# Patient Record
Sex: Female | Born: 1965 | Race: Black or African American | Hispanic: No | Marital: Single | Smoking: Never smoker
Health system: Southern US, Community
[De-identification: ages and names within clinical notes are randomized; demographics above are authoritative.]

## PROBLEM LIST (undated history)

## (undated) DIAGNOSIS — D573 Sickle-cell trait: Secondary | ICD-10-CM

## (undated) DIAGNOSIS — Z5189 Encounter for other specified aftercare: Secondary | ICD-10-CM

## (undated) DIAGNOSIS — D219 Benign neoplasm of connective and other soft tissue, unspecified: Secondary | ICD-10-CM

## (undated) DIAGNOSIS — N946 Dysmenorrhea, unspecified: Secondary | ICD-10-CM

## (undated) DIAGNOSIS — B373 Candidiasis of vulva and vagina: Secondary | ICD-10-CM

## (undated) DIAGNOSIS — Z9889 Other specified postprocedural states: Secondary | ICD-10-CM

## (undated) DIAGNOSIS — B3731 Acute candidiasis of vulva and vagina: Secondary | ICD-10-CM

## (undated) DIAGNOSIS — K4090 Unilateral inguinal hernia, without obstruction or gangrene, not specified as recurrent: Secondary | ICD-10-CM

## (undated) DIAGNOSIS — N920 Excessive and frequent menstruation with regular cycle: Secondary | ICD-10-CM

## (undated) DIAGNOSIS — R102 Pelvic and perineal pain: Secondary | ICD-10-CM

## (undated) DIAGNOSIS — D649 Anemia, unspecified: Secondary | ICD-10-CM

## (undated) DIAGNOSIS — I10 Essential (primary) hypertension: Secondary | ICD-10-CM

## (undated) HISTORY — DX: Sickle-cell trait: D57.3

## (undated) HISTORY — DX: Essential (primary) hypertension: I10

## (undated) HISTORY — DX: Pelvic and perineal pain: R10.2

## (undated) HISTORY — PX: HYSTEROSCOPY WITH D & C: SHX1775

## (undated) HISTORY — PX: INDUCED ABORTION: SHX677

## (undated) HISTORY — DX: Unilateral inguinal hernia, without obstruction or gangrene, not specified as recurrent: K40.90

## (undated) HISTORY — DX: Candidiasis of vulva and vagina: B37.3

## (undated) HISTORY — DX: Acute candidiasis of vulva and vagina: B37.31

## (undated) HISTORY — DX: Benign neoplasm of connective and other soft tissue, unspecified: D21.9

## (undated) HISTORY — DX: Dysmenorrhea, unspecified: N94.6

## (undated) HISTORY — DX: Encounter for other specified aftercare: Z51.89

## (undated) HISTORY — DX: Excessive and frequent menstruation with regular cycle: N92.0

---

## 1998-01-07 ENCOUNTER — Ambulatory Visit (HOSPITAL_COMMUNITY): Admission: RE | Admit: 1998-01-07 | Discharge: 1998-01-07 | Payer: Self-pay | Admitting: Obstetrics & Gynecology

## 1998-02-28 ENCOUNTER — Encounter: Admission: RE | Admit: 1998-02-28 | Discharge: 1998-05-29 | Payer: Self-pay | Admitting: *Deleted

## 1998-03-19 ENCOUNTER — Encounter: Admission: RE | Admit: 1998-03-19 | Discharge: 1998-03-19 | Payer: Self-pay | Admitting: Internal Medicine

## 1998-03-25 ENCOUNTER — Encounter: Admission: RE | Admit: 1998-03-25 | Discharge: 1998-03-25 | Payer: Self-pay | Admitting: Internal Medicine

## 1998-04-01 ENCOUNTER — Encounter: Admission: RE | Admit: 1998-04-01 | Discharge: 1998-04-01 | Payer: Self-pay | Admitting: Internal Medicine

## 1998-04-16 ENCOUNTER — Encounter: Admission: RE | Admit: 1998-04-16 | Discharge: 1998-04-16 | Payer: Self-pay | Admitting: Obstetrics & Gynecology

## 1998-04-30 ENCOUNTER — Encounter: Admission: RE | Admit: 1998-04-30 | Discharge: 1998-04-30 | Payer: Self-pay | Admitting: Obstetrics & Gynecology

## 1998-12-02 ENCOUNTER — Encounter: Admission: RE | Admit: 1998-12-02 | Discharge: 1998-12-02 | Payer: Self-pay | Admitting: Internal Medicine

## 1999-03-18 ENCOUNTER — Encounter: Admission: RE | Admit: 1999-03-18 | Discharge: 1999-03-18 | Payer: Self-pay | Admitting: Obstetrics & Gynecology

## 1999-04-09 ENCOUNTER — Encounter: Admission: RE | Admit: 1999-04-09 | Discharge: 1999-04-09 | Payer: Self-pay | Admitting: Internal Medicine

## 1999-07-01 ENCOUNTER — Encounter: Admission: RE | Admit: 1999-07-01 | Discharge: 1999-07-01 | Payer: Self-pay | Admitting: Internal Medicine

## 1999-09-25 ENCOUNTER — Other Ambulatory Visit: Admission: RE | Admit: 1999-09-25 | Discharge: 1999-09-25 | Payer: Self-pay | Admitting: Obstetrics

## 1999-09-25 ENCOUNTER — Encounter: Admission: RE | Admit: 1999-09-25 | Discharge: 1999-09-25 | Payer: Self-pay | Admitting: Internal Medicine

## 2000-01-02 ENCOUNTER — Encounter: Admission: RE | Admit: 2000-01-02 | Discharge: 2000-01-02 | Payer: Self-pay | Admitting: Internal Medicine

## 2000-01-07 ENCOUNTER — Encounter: Admission: RE | Admit: 2000-01-07 | Discharge: 2000-01-07 | Payer: Self-pay | Admitting: Internal Medicine

## 2000-02-11 ENCOUNTER — Encounter: Admission: RE | Admit: 2000-02-11 | Discharge: 2000-02-11 | Payer: Self-pay | Admitting: Internal Medicine

## 2000-04-02 ENCOUNTER — Encounter: Admission: RE | Admit: 2000-04-02 | Discharge: 2000-04-02 | Payer: Self-pay | Admitting: Hematology and Oncology

## 2000-08-06 ENCOUNTER — Encounter: Admission: RE | Admit: 2000-08-06 | Discharge: 2000-08-06 | Payer: Self-pay | Admitting: Internal Medicine

## 2000-09-08 ENCOUNTER — Encounter: Admission: RE | Admit: 2000-09-08 | Discharge: 2000-09-08 | Payer: Self-pay | Admitting: Internal Medicine

## 2000-09-27 ENCOUNTER — Encounter: Admission: RE | Admit: 2000-09-27 | Discharge: 2000-09-27 | Payer: Self-pay | Admitting: Internal Medicine

## 2000-09-27 ENCOUNTER — Ambulatory Visit (HOSPITAL_COMMUNITY): Admission: RE | Admit: 2000-09-27 | Discharge: 2000-09-27 | Payer: Self-pay | Admitting: Infectious Diseases

## 2000-10-05 ENCOUNTER — Encounter: Admission: RE | Admit: 2000-10-05 | Discharge: 2000-10-05 | Payer: Self-pay | Admitting: Internal Medicine

## 2000-12-16 ENCOUNTER — Encounter: Admission: RE | Admit: 2000-12-16 | Discharge: 2000-12-16 | Payer: Self-pay | Admitting: Obstetrics

## 2001-06-29 ENCOUNTER — Encounter: Admission: RE | Admit: 2001-06-29 | Discharge: 2001-06-29 | Payer: Self-pay | Admitting: Internal Medicine

## 2001-07-08 ENCOUNTER — Encounter: Admission: RE | Admit: 2001-07-08 | Discharge: 2001-07-08 | Payer: Self-pay | Admitting: Obstetrics & Gynecology

## 2001-07-26 ENCOUNTER — Encounter: Admission: RE | Admit: 2001-07-26 | Discharge: 2001-10-24 | Payer: Self-pay | Admitting: Internal Medicine

## 2001-08-17 ENCOUNTER — Encounter: Admission: RE | Admit: 2001-08-17 | Discharge: 2001-08-17 | Payer: Self-pay | Admitting: Internal Medicine

## 2002-05-15 ENCOUNTER — Encounter: Payer: Self-pay | Admitting: Internal Medicine

## 2002-05-15 ENCOUNTER — Encounter: Admission: RE | Admit: 2002-05-15 | Discharge: 2002-05-15 | Payer: Self-pay | Admitting: Internal Medicine

## 2002-06-15 ENCOUNTER — Ambulatory Visit (HOSPITAL_COMMUNITY): Admission: RE | Admit: 2002-06-15 | Discharge: 2002-06-15 | Payer: Self-pay | Admitting: Obstetrics

## 2002-06-15 ENCOUNTER — Encounter: Payer: Self-pay | Admitting: Obstetrics

## 2003-07-10 ENCOUNTER — Other Ambulatory Visit: Admission: RE | Admit: 2003-07-10 | Discharge: 2003-07-10 | Payer: Self-pay | Admitting: Obstetrics and Gynecology

## 2005-05-05 ENCOUNTER — Other Ambulatory Visit: Admission: RE | Admit: 2005-05-05 | Discharge: 2005-05-05 | Payer: Self-pay | Admitting: Obstetrics and Gynecology

## 2005-05-25 ENCOUNTER — Ambulatory Visit (HOSPITAL_COMMUNITY): Admission: RE | Admit: 2005-05-25 | Discharge: 2005-05-25 | Payer: Self-pay | Admitting: Obstetrics and Gynecology

## 2006-03-09 ENCOUNTER — Encounter: Admission: RE | Admit: 2006-03-09 | Discharge: 2006-03-09 | Payer: Self-pay | Admitting: Internal Medicine

## 2006-05-07 ENCOUNTER — Other Ambulatory Visit: Admission: RE | Admit: 2006-05-07 | Discharge: 2006-05-07 | Payer: Self-pay | Admitting: Obstetrics and Gynecology

## 2006-08-04 ENCOUNTER — Emergency Department (HOSPITAL_COMMUNITY): Admission: EM | Admit: 2006-08-04 | Discharge: 2006-08-04 | Payer: Self-pay | Admitting: Emergency Medicine

## 2006-08-17 ENCOUNTER — Encounter: Admission: RE | Admit: 2006-08-17 | Discharge: 2006-09-03 | Payer: Self-pay | Admitting: General Practice

## 2006-09-10 ENCOUNTER — Ambulatory Visit (HOSPITAL_COMMUNITY): Admission: RE | Admit: 2006-09-10 | Discharge: 2006-09-10 | Payer: Self-pay | Admitting: Obstetrics and Gynecology

## 2006-09-10 ENCOUNTER — Encounter (INDEPENDENT_AMBULATORY_CARE_PROVIDER_SITE_OTHER): Payer: Self-pay | Admitting: *Deleted

## 2007-02-01 ENCOUNTER — Encounter: Admission: RE | Admit: 2007-02-01 | Discharge: 2007-02-01 | Payer: Self-pay | Admitting: Internal Medicine

## 2007-03-11 ENCOUNTER — Encounter: Admission: RE | Admit: 2007-03-11 | Discharge: 2007-03-11 | Payer: Self-pay | Admitting: Internal Medicine

## 2007-03-15 ENCOUNTER — Encounter: Admission: RE | Admit: 2007-03-15 | Discharge: 2007-03-15 | Payer: Self-pay | Admitting: Gastroenterology

## 2007-03-16 ENCOUNTER — Encounter: Admission: RE | Admit: 2007-03-16 | Discharge: 2007-03-16 | Payer: Self-pay | Admitting: Internal Medicine

## 2008-09-27 ENCOUNTER — Encounter: Admission: RE | Admit: 2008-09-27 | Discharge: 2008-09-27 | Payer: Self-pay | Admitting: Internal Medicine

## 2009-11-01 ENCOUNTER — Encounter: Admission: RE | Admit: 2009-11-01 | Discharge: 2009-11-01 | Payer: Self-pay | Admitting: Internal Medicine

## 2011-04-03 NOTE — Op Note (Signed)
NAME:  Dana Fowler, Dana Fowler                ACCOUNT NO.:  [id number]   MEDICAL RECORD NO.:  [id number]          PATIENT TYPE:  AMB   LOCATION:  SDC                           FACILITY:  [facility]   PHYSICIAN:  Naima A. Dillard, M.D. DATE OF BIRTH:  1966/08/21   DATE OF PROCEDURE:  09/10/2006  DATE OF DISCHARGE:                                 OPERATIVE REPORT   PREOPERATIVE DIAGNOSIS:  Menorrhagia with endometrial polyps and fibroids.   POSTOPERATIVE DIAGNOSIS:  Menorrhagia with endometrial polyps and fibroids.   PROCEDURE:  Dilation and evacuation, hysteroscopy, polypectomy.   SURGEON:  Naima A. Dillard, M.D.   ASSISTANT:  None.   ANESTHESIA:  General laryngeal mask airway and 10 mL 1% lidocaine for  cervical block.   SPECIMENS:  Endometrial curettings and endometrial polyps.   ESTIMATED BLOOD LOSS:  Minimal.   COMPLICATIONS:  None.   DISPOSITION:  Patient to PACU in stable condition.   PROCEDURE IN DETAIL:  The patient was taken to the operating room where she  was placed in the dorsal lithotomy position  and given general anesthesia,  prepped and draped in a normal sterile fashion.  The bladder was drained  with a straight catheter.  The patient was examined and noted  to have an  anteverted uterus with no adnexal masses.  A bivalve speculum was placed  into the vagina.  The anterior lip of the cervix was grasped with a single  tooth tenaculum.  The cervix was dilated with Pratt dilators up to 21.  The  hysteroscope was placed into the uterine cavity all the way to the fundus.  Both ostia were seen.  The patient had two polyps near the fundus, no  submucosal fibroids.  Polyp forceps were placed into the uterus and some of  the polyp was removed.  I looked again and some of the polyp still remained.  The resectoscope was used to remove most of the remaining polyp.  Then, a  D&C was done and I also used polyp forceps through the scope to remove the  polyp.  Again, no submucosal fibroids  were seen.  Two polyps were removed  and endometrial curettings were sent to pathology.  All instruments were  then removed from the vagina and cervix.  Sponge, lap, and needle counts  were correct.  The patient had a 200 mL deficit of 3% Sorbitol.     Naima A. Normand Sloop, M.D.  Electronically Signed    NAD/MEDQ  D:  09/10/2006  T:  09/11/2006  Job:  [id number]

## 2011-04-03 NOTE — H&P (Signed)
NAME:  Dana Fowler, Dana Fowler                ACCOUNT NO.:  [id number]   MEDICAL RECORD NO.:  [id number]          PATIENT TYPE:  AMB   LOCATION:                                FACILITY:  [facility]   PHYSICIAN:  Naima A. Dillard, M.D. DATE OF BIRTH:  1966/06/30   DATE OF ADMISSION:  08/17/2006  DATE OF DISCHARGE:  09/03/2006                                HISTORY & PHYSICAL   HISTORY OF PRESENT ILLNESS:  The patient is a 45 year old female gravida 1,  para 0 who had an HSG back in 2006 which showed a questionable submucosal  fibroid. The patient has had a sonohysterogram recently on July 27, 2006 which was significant for three hyperechoic masses, the first one  measuring 1.84 x 1.49, the second one measuring 1.0 x 0.92 and the third one  measuring 1.02 x 0.77 cm. Ultrasound was also significant for measuring 8.43  x 6.07 x 4.98. Both ovaries were normal. The patient has two fibroids, one  is pedunculated measuring 6 x 5 x 5.89 and one is posterior and measuring 2  x 1 x 2.23 cm.  The patient says her periods last from 7-9 days and she has pain with her  periods. She also has a long-standing history of infertility.   PAST MEDICAL HISTORY:  Significant for non-insulin-dependent diabetes and  hypertension.   ALLERGIES:  PENICILLIN.   MEDICATIONS:  Include Metformin, glyburide and Benicar.   SOCIAL HISTORY:  The patient denies any tobacco, alcohol or illicit drug  use.   FAMILY HISTORY:  Significant in diabetes in her father, mother, and  hypertension. She denies any GYN cancer.   PHYSICAL EXAMINATION:  VITAL SIGNS: Blood pressure was 120/90. Her weight is  152 pounds.  HEENT:  Pupils are equal. Throat is clear.  NECK: Thyroid is not enlarged.  HEART: Regular rate and rhythm.  LUNGS: Clear to auscultation bilaterally.  BREASTS:  Have no masses, discharge, skin changes or nipple retraction  bilaterally.  BACK: Has no CVA tenderness bilaterally.  ABDOMEN: Nontender without any mass or  organomegaly.  EXTREMITIES: No clubbing, cyanosis or edema.  PELVIC EXAM:  Within normal limits. Vulva within normal limits. Cervix was  nontender without any lesions. Uterus is of normal shape and size, nothing  significant on exam. Adnexa nontender without masses.   ASSESSMENT:  1. Menorrhagia.  2. Pelvic pain.  3. Three endometrial polyps.  4. Fibroids.  5. Infertility.   The patient was offered D&C, hysteroscopy, polypectomy, possible myomectomy  and laparoscopy. The patient declined laparoscopy. She wishes to go with  just a D&C, hysteroscopy and polypectomy. She does not any further  work up with her infertility at this time. She does not have a partner and  is not interested in donor insemination. The patient understands the risks  are but not limited to bleeding, infection, damage to internal organs such  as bowel, bladder, major blood vessels.      Naima A. Normand Sloop, M.D.  Electronically Signed     NAD/MEDQ  D:  09/09/2006  T:  09/10/2006  Job:  [id number]

## 2012-01-15 ENCOUNTER — Other Ambulatory Visit: Payer: Self-pay | Admitting: Internal Medicine

## 2012-01-15 DIAGNOSIS — Z1231 Encounter for screening mammogram for malignant neoplasm of breast: Secondary | ICD-10-CM

## 2012-02-12 ENCOUNTER — Ambulatory Visit
Admission: RE | Admit: 2012-02-12 | Discharge: 2012-02-12 | Disposition: A | Discharge status: Home / Self Care | Payer: 59 | Source: Ambulatory Visit | Attending: Internal Medicine | Admitting: Internal Medicine

## 2012-02-12 DIAGNOSIS — Z1231 Encounter for screening mammogram for malignant neoplasm of breast: Secondary | ICD-10-CM

## 2012-07-29 ENCOUNTER — Telehealth: Payer: Self-pay | Admitting: Obstetrics and Gynecology

## 2012-08-22 ENCOUNTER — Ambulatory Visit (INDEPENDENT_AMBULATORY_CARE_PROVIDER_SITE_OTHER): Payer: 59 | Admitting: Obstetrics and Gynecology

## 2012-08-22 ENCOUNTER — Encounter: Payer: Self-pay | Admitting: Obstetrics and Gynecology

## 2012-08-22 VITALS — BP 140/88 | Temp 98.5°F | Ht 64.0 in | Wt 163.0 lb

## 2012-08-22 DIAGNOSIS — Z2089 Contact with and (suspected) exposure to other communicable diseases: Secondary | ICD-10-CM

## 2012-08-22 DIAGNOSIS — Z20818 Contact with and (suspected) exposure to other bacterial communicable diseases: Secondary | ICD-10-CM

## 2012-08-22 DIAGNOSIS — L0291 Cutaneous abscess, unspecified: Secondary | ICD-10-CM

## 2012-08-22 DIAGNOSIS — L0390 Cellulitis, unspecified: Secondary | ICD-10-CM

## 2012-08-22 MED ORDER — CIPROFLOXACIN HCL 500 MG PO TABS: 500.0000 mg | ORAL_TABLET | Freq: Two times a day (BID) | ORAL | Status: AC

## 2012-08-22 MED ORDER — METRONIDAZOLE 500 MG PO TABS: 500.0000 mg | ORAL_TABLET | Freq: Two times a day (BID) | ORAL | Status: DC

## 2012-08-22 NOTE — Progress Notes (Signed)
Vag. Discharge:no Odor:no Fever:no Irreg.Periods:yes Dyspareunia:yes Dysuria:no Frequency:yes Urgency:no Hematuria:no Kidney stones:no Constipation:no Diarrhea:no Rectal Bleeding: no Vomiting:no Nausea:no Pregnant:no Fibroids:yes Endometriosis:no BP 140/88  Temp 98.5 F (36.9 C) (Oral)  Ht 5\' 4"  (1.626 m)  Wt 163 lb (73.936 kg)  BMI 27.98 kg/m2  LMP 08/11/2012 Pt c/o painful and ire Hx of Ovarian Cyst: yes Hx IUD:no Hx STD-PID:no Appendectomy:no Gall Bladder Dz:no Pt with several complaints.  She states she has had abdominal pain for over 6 months.  She has fibroids and states her periods are painful.  Nothing makes it better or worse.  Her periods are also very heavy.  She also has an abscess on her right thigh.  It is painful and has been present for three days.  She is diabetic and states her fasting blood sugars have been elevated Physical Examination: General appearance - alert, well appearing, and in no distress Heart - normal rate and regular rhythm Abdomen - soft, nontender, nondistended, no masses or organomegaly Uterus palpated and tender about 14-16 week size Pelvic - normal external genitalia, vulva, vagina, cervix, uterus and adnexa, UTERUS: enlarged to 14-16 week's size, irregular, mobile Extremities - peripheral pulses normal, no pedal edema, no clubbing or cyanosis Skin - on the pts right thigh just below her groin is about a three cm abscess with red erythema extending down to the mid thigh.  It is tender and no drainage is present.  Symptomatic fibroids Thigh abscess [place] /EMBX@NV  Consent obtained her thifh was prepped with betadine.  Three cc 1% lidocaine used for anesthesia.  A small incision was made with the knife and the abscess was drained.  Blood and purulent material was drained from the abscess.  The abscess was packed and covered.  A culture was obtained.  Pt will take cipro and flagyl.

## 2012-08-22 NOTE — Patient Instructions (Signed)
Abscess An abscess is an infected area that contains a collection of pus and debris. It can occur in almost any part of the body. An abscess is also known as a furuncle or boil. CAUSES   An abscess occurs when tissue gets infected. This can occur from blockage of oil or sweat glands, infection of hair follicles, or a minor injury to the skin. As the body tries to fight the infection, pus collects in the area and creates pressure under the skin. This pressure causes pain. People with weakened immune systems have difficulty fighting infections and get certain abscesses more often.   SYMPTOMS Usually an abscess develops on the skin and becomes a painful mass that is red, warm, and tender. If the abscess forms under the skin, you may feel a moveable soft area under the skin. Some abscesses break open (rupture) on their own, but most will continue to get worse without care. The infection can spread deeper into the body and eventually into the bloodstream, causing you to feel ill.   DIAGNOSIS   Your caregiver will take your medical history and perform a physical exam. A sample of fluid may also be taken from the abscess to determine what is causing your infection. TREATMENT   Your caregiver may prescribe antibiotic medicines to fight the infection. However, taking antibiotics alone usually does not cure an abscess. Your caregiver may need to make a small cut (incision) in the abscess to drain the pus. In some cases, gauze is packed into the abscess to reduce pain and to continue draining the area. HOME CARE INSTRUCTIONS    Only take over-the-counter or prescription medicines for pain, discomfort, or fever as directed by your caregiver.   If you were prescribed antibiotics, take them as directed. Finish them even if you start to feel better.   If gauze is used, follow your caregiver's directions for changing the gauze.   To avoid spreading the infection:   Keep your draining abscess covered with a  bandage.   Wash your hands well.   Do not share personal care items, towels, or whirlpools with others.   Avoid skin contact with others.   Keep your skin and clothes clean around the abscess.   Keep all follow-up appointments as directed by your caregiver.  SEEK MEDICAL CARE IF:    You have increased pain, swelling, redness, fluid drainage, or bleeding.   You have muscle aches, chills, or a general ill feeling.   You have a fever.  MAKE SURE YOU:    Understand these instructions.   Will watch your condition.   Will get help right away if you are not doing well or get worse.  Document Released: 08/12/2005 Document Revised: 05/03/2012 Document Reviewed: 01/15/2012 Exit[employer] Patient Information 2013 [employer].    

## 2012-08-23 DIAGNOSIS — L0291 Cutaneous abscess, unspecified: Secondary | ICD-10-CM | POA: Insufficient documentation

## 2012-08-25 LAB — WOUND CULTURE: Gram Stain: NONE SEEN

## 2012-08-30 ENCOUNTER — Telehealth: Payer: Self-pay | Admitting: Obstetrics and Gynecology

## 2012-08-30 NOTE — Telephone Encounter (Signed)
ND to address 

## 2012-08-31 NOTE — Telephone Encounter (Signed)
nd pt 

## 2012-08-31 NOTE — Telephone Encounter (Signed)
Pt can put peroxide on it BID.  If the wound is not healing she needs to be seen.

## 2012-08-31 NOTE — Telephone Encounter (Signed)
Spoke with pt informed ND recommendations pt voice understanding

## 2012-09-06 ENCOUNTER — Other Ambulatory Visit: Payer: 59

## 2012-09-06 ENCOUNTER — Encounter: Payer: 59 | Admitting: Obstetrics and Gynecology

## 2012-09-09 ENCOUNTER — Other Ambulatory Visit: Payer: Self-pay

## 2012-09-09 DIAGNOSIS — D219 Benign neoplasm of connective and other soft tissue, unspecified: Secondary | ICD-10-CM

## 2012-09-12 ENCOUNTER — Encounter: Payer: Self-pay | Admitting: Obstetrics and Gynecology

## 2012-09-12 ENCOUNTER — Ambulatory Visit (INDEPENDENT_AMBULATORY_CARE_PROVIDER_SITE_OTHER): Payer: 59

## 2012-09-12 ENCOUNTER — Ambulatory Visit (INDEPENDENT_AMBULATORY_CARE_PROVIDER_SITE_OTHER): Payer: 59 | Admitting: Obstetrics and Gynecology

## 2012-09-12 VITALS — BP 140/88 | Wt 162.0 lb

## 2012-09-12 DIAGNOSIS — D259 Leiomyoma of uterus, unspecified: Secondary | ICD-10-CM

## 2012-09-12 DIAGNOSIS — D219 Benign neoplasm of connective and other soft tissue, unspecified: Secondary | ICD-10-CM | POA: Insufficient documentation

## 2012-09-12 DIAGNOSIS — N920 Excessive and frequent menstruation with regular cycle: Secondary | ICD-10-CM

## 2012-09-12 LAB — POCT URINE PREGNANCY: Preg Test, Ur: NEGATIVE

## 2012-09-12 NOTE — Patient Instructions (Signed)
Fibroids Fibroids are lumps (tumors) that can occur any place in a woman's body. These lumps are not cancerous. Fibroids vary in size, weight, and where they grow. HOME CARE  Do not take aspirin.  Write down the number of pads or tampons you use during your period. Tell your doctor. This can help determine the best treatment for you. GET HELP RIGHT AWAY IF:  You have pain in your lower belly (abdomen) that is not helped with medicine.  You have cramps that are not helped with medicine.  You have more bleeding between or during your period.  You feel lightheaded or pass out (faint).  Your lower belly pain gets worse. MAKE SURE YOU:  Understand these instructions.  Will watch your condition.  Will get help right away if you are not doing well or get worse. Document Released: 12/05/2010 Document Revised: 01/25/2012 Document Reviewed: 12/05/2010 [employer] Patient Information 2013 [employer].  

## 2012-09-12 NOTE — Progress Notes (Signed)
U/s, embx  BP 140/88  Wt 162 lb (73.483 kg)  LMP 09/05/2012 Physical Examination: General appearance - alert, well appearing, and in no distress Heart - normal rate and regular rhythm Abdomen - soft, nontender, nondistended, no masses or organomegaly Pelvic - normal external genitalia, vulva, vagina, cervix, uterus irregular about 14 week size and adnexa [place] 9.91cm by 12.6 cm and irregular.  9.5 cm fibroid.  Adnexa are normal bilaterally Sx fibroids EMBX done per protocol.  Uterus sounded ot 11 cm All treatments for firbroids reviewed with the pt She will call with decision

## 2012-09-14 LAB — PATHOLOGY

## 2012-09-21 ENCOUNTER — Telehealth: Payer: Self-pay

## 2012-09-21 MED ORDER — DOXYCYCLINE HYCLATE 100 MG PO TABS: 100.0000 mg | ORAL_TABLET | Freq: Two times a day (BID) | ORAL | Status: AC

## 2012-09-21 NOTE — Telephone Encounter (Signed)
Spoke with pt rgd labs informed results and rx sent to pharm pt voice understanding

## 2012-09-21 NOTE — Telephone Encounter (Signed)
Message copied by Rolla Plate on Wed Sep 21, 2012  9:19 AM ------      Message from: Jaymes Graff      Created: Mon Sep 19, 2012  8:21 PM       Please call the patient with the results.  I recommend doxycycline 100 mg BID for 7 days Disp # 14

## 2012-11-01 ENCOUNTER — Telehealth: Payer: Self-pay

## 2012-11-07 ENCOUNTER — Telehealth: Payer: Self-pay

## 2012-11-07 NOTE — Telephone Encounter (Signed)
Tc to pt, advised that ND does not recommend another u/s but that pt can make appointment to discuss treatment options again. Pt states she will call back with her decision.

## 2013-02-07 ENCOUNTER — Other Ambulatory Visit: Payer: Self-pay | Admitting: Internal Medicine

## 2013-02-07 DIAGNOSIS — Z1231 Encounter for screening mammogram for malignant neoplasm of breast: Secondary | ICD-10-CM

## 2013-03-10 ENCOUNTER — Ambulatory Visit
Admission: RE | Admit: 2013-03-10 | Discharge: 2013-03-10 | Disposition: A | Discharge status: Home / Self Care | Payer: 59 | Source: Ambulatory Visit | Attending: Internal Medicine | Admitting: Internal Medicine

## 2013-03-10 DIAGNOSIS — Z1231 Encounter for screening mammogram for malignant neoplasm of breast: Secondary | ICD-10-CM

## 2013-04-20 ENCOUNTER — Emergency Department (HOSPITAL_COMMUNITY)
Admission: EM | Admit: 2013-04-20 | Discharge: 2013-04-20 | Disposition: A | Discharge status: Home / Self Care | Payer: 59 | Attending: Emergency Medicine | Admitting: Emergency Medicine

## 2013-04-20 ENCOUNTER — Emergency Department (HOSPITAL_COMMUNITY): Payer: 59

## 2013-04-20 ENCOUNTER — Encounter (HOSPITAL_COMMUNITY): Payer: Self-pay | Admitting: Emergency Medicine

## 2013-04-20 DIAGNOSIS — I10 Essential (primary) hypertension: Secondary | ICD-10-CM | POA: Insufficient documentation

## 2013-04-20 DIAGNOSIS — Z79899 Other long term (current) drug therapy: Secondary | ICD-10-CM | POA: Insufficient documentation

## 2013-04-20 DIAGNOSIS — M7989 Other specified soft tissue disorders: Secondary | ICD-10-CM

## 2013-04-20 DIAGNOSIS — R531 Weakness: Secondary | ICD-10-CM

## 2013-04-20 DIAGNOSIS — R609 Edema, unspecified: Secondary | ICD-10-CM | POA: Insufficient documentation

## 2013-04-20 DIAGNOSIS — E119 Type 2 diabetes mellitus without complications: Secondary | ICD-10-CM | POA: Insufficient documentation

## 2013-04-20 DIAGNOSIS — Z8742 Personal history of other diseases of the female genital tract: Secondary | ICD-10-CM | POA: Insufficient documentation

## 2013-04-20 DIAGNOSIS — R42 Dizziness and giddiness: Secondary | ICD-10-CM | POA: Insufficient documentation

## 2013-04-20 DIAGNOSIS — Z3202 Encounter for pregnancy test, result negative: Secondary | ICD-10-CM | POA: Insufficient documentation

## 2013-04-20 DIAGNOSIS — D649 Anemia, unspecified: Secondary | ICD-10-CM | POA: Insufficient documentation

## 2013-04-20 DIAGNOSIS — D573 Sickle-cell trait: Secondary | ICD-10-CM | POA: Insufficient documentation

## 2013-04-20 DIAGNOSIS — Z794 Long term (current) use of insulin: Secondary | ICD-10-CM | POA: Insufficient documentation

## 2013-04-20 LAB — URINALYSIS, ROUTINE W REFLEX MICROSCOPIC
Leukocytes, UA: NEGATIVE
Nitrite: NEGATIVE
Specific Gravity, Urine: 1.017 (ref 1.005–1.030)
Urobilinogen, UA: 0.2 mg/dL (ref 0.0–1.0)
pH: 5 (ref 5.0–8.0)

## 2013-04-20 LAB — CBC WITH DIFFERENTIAL/PLATELET
Basophils Absolute: 0 10*3/uL (ref 0.0–0.1)
Basophils Relative: 0 % (ref 0–1)
Eosinophils Absolute: 0.2 10*3/uL (ref 0.0–0.7)
Hemoglobin: 9.5 g/dL — ABNORMAL LOW (ref 12.0–15.0)
MCH: 21.1 pg — ABNORMAL LOW (ref 26.0–34.0)
MCHC: 30.2 g/dL (ref 30.0–36.0)
Monocytes Relative: 9 % (ref 3–12)
Neutro Abs: 3.4 10*3/uL (ref 1.7–7.7)
Neutrophils Relative %: 56 % (ref 43–77)
RDW: 17 % — ABNORMAL HIGH (ref 11.5–15.5)

## 2013-04-20 LAB — COMPREHENSIVE METABOLIC PANEL
AST: 22 U/L (ref 0–37)
Albumin: 3.4 g/dL — ABNORMAL LOW (ref 3.5–5.2)
BUN: 18 mg/dL (ref 6–23)
Creatinine, Ser: 1 mg/dL (ref 0.50–1.10)
Potassium: 4.3 mEq/L (ref 3.5–5.1)
Total Protein: 7.7 g/dL (ref 6.0–8.3)

## 2013-04-20 LAB — T4, FREE: Free T4: 0.94 ng/dL (ref 0.80–1.80)

## 2013-04-20 LAB — TSH: TSH: 2.126 u[IU]/mL (ref 0.350–4.500)

## 2013-04-20 LAB — PREGNANCY, URINE: Preg Test, Ur: NEGATIVE

## 2013-04-20 LAB — TROPONIN I: Troponin I: <0.3 ng/mL (ref <0.30)

## 2013-04-20 LAB — D-DIMER, QUANTITATIVE: D-Dimer, Quant: 0.99 ug/mL-FEU — ABNORMAL HIGH (ref 0.00–0.48)

## 2013-04-20 MED ORDER — SODIUM CHLORIDE 0.9 % IV SOLN
INTRAVENOUS | Status: DC
  Administered 2013-04-20: 18:00:00 via INTRAVENOUS

## 2013-04-20 MED ORDER — SODIUM CHLORIDE 0.9 % IV BOLUS (SEPSIS)
1000.0000 mL | Freq: Once | INTRAVENOUS | Status: AC
  Administered 2013-04-20: 1000 mL via INTRAVENOUS

## 2013-04-20 MED ORDER — IOHEXOL 350 MG/ML SOLN
100.0000 mL | Freq: Once | INTRAVENOUS | Status: AC | PRN
  Administered 2013-04-20: 100 mL via INTRAVENOUS

## 2013-04-20 NOTE — ED Notes (Signed)
Per patient, BP has been high on and off for 2 weeks-states she feels it is now under control-has been feeling fatigued and feels winded when she speaks

## 2013-04-20 NOTE — ED Notes (Signed)
Patient ambulating around hallway..

## 2013-04-20 NOTE — ED Notes (Signed)
Patient able to ambulate without dizziness.

## 2013-04-20 NOTE — Progress Notes (Addendum)
*  Preliminary Results* Right lower extremity venous duplex completed. Right lower extremity is negative for deep vein thrombosis. There is no evidence of right Baker's cyst. Preliminary results discussed with Aram Beecham, RN.  04/20/2013 4:26 PM Gertie Fey, RDMS, RDCS

## 2013-04-20 NOTE — ED Notes (Signed)
Per pt, states RLE was swollen a couple of weeks ago-PCP treated

## 2013-04-20 NOTE — ED Notes (Signed)
Patient transported to CT 

## 2013-04-20 NOTE — ED Notes (Signed)
Patient having right leg and hip pain, denies injury.  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

## 2013-04-20 NOTE — ED Provider Notes (Signed)
History     CSN: [id number]  Arrival date & time 04/20/13  1506   First MD Initiated Contact with Patient 04/20/13 1545      Chief Complaint  Patient presents with  . lethargic     (Consider location/radiation/quality/duration/timing/severity/associated sxs/prior treatment) HPI Comments: Patient has a history of diabetes hypertension and fibroids presenting with a two-week history of generalized weakness and fatigue. States she feels that she has no energy and feels weak all over. Denies any focal weakness, numbness or tingling. She endorses good by mouth intake and urine output. States her sugars have been in the 100-200 range at home. Denies any chest pain, shortness of breath, nausea, vomiting, blood in the stools. Patient states she has a history of fibroids and has had pelvic pain on and off for many months. She states her periods have been heavy but reports no history of anemia. Reports that the swelling in her right lower leg for quite some time as well which has been worked up by her PCP.  The history is provided by the patient.    Past Medical History  Diagnosis Date  . Diabetes mellitus   . Hypertension   . Pelvic pain in female   . Dysmenorrhea   . Fibroids   . Menorrhagia   . Yeast vaginitis   . Sickle cell trait     Past Surgical History  Procedure Laterality Date  . Hysteroscopy w/d&c      Family History  Problem Relation Age of Onset  . Hypertension Maternal Grandfather   . Diabetes Father   . Diabetes Mother   . Hypertension Sister     History  Substance Use Topics  . Smoking status: Never Smoker   . Smokeless tobacco: Not on file  . Alcohol Use: No    OB History   Grav Para Term Preterm Abortions TAB SAB Ect Mult Living   1 0        0      Review of Systems  Constitutional: Positive for fatigue. Negative for fever, activity change and appetite change.  HENT: Negative for congestion, rhinorrhea and neck pain.   Respiratory: Negative for  cough, chest tightness and shortness of breath.   Cardiovascular: Positive for leg swelling. Negative for chest pain.  Gastrointestinal: Negative for nausea, vomiting and abdominal pain.  Genitourinary: Negative for dysuria, hematuria and vaginal discharge.  Musculoskeletal: Negative for back pain.  Skin: Negative for rash.  Neurological: Positive for weakness and light-headedness. Negative for dizziness and headaches.  A complete 10 system review of systems was obtained and all systems are negative except as noted in the HPI and PMH.    Allergies  Penicillins  Home Medications   Current Outpatient Rx  Name  Route  Sig  Dispense  Refill  . glimepiride (AMARYL) 4 MG tablet   Oral   Take 4 mg by mouth 2 (two) times daily.         . insulin glargine (LANTUS) 100 UNIT/ML injection   Subcutaneous   Inject 26-34 Units into the skin at bedtime. Depends on blood glucose when checking at night         . olmesartan-hydrochlorothiazide (BENICAR HCT) 40-12.5 MG per tablet   Oral   Take 1 tablet by mouth daily.         . sitaGLIPtan-metformin (JANUMET) 50-1000 MG per tablet   Oral   Take 1 tablet by mouth 2 (two) times daily with a meal.  BP 136/87  Pulse 93  Temp(Src) 98.2 F (36.8 C) (Oral)  Resp 20  SpO2 100%  LMP 04/02/2013  Physical Exam  Constitutional: She is oriented to person, place, and time. She appears well-developed and well-nourished. No distress.  HENT:  Head: Normocephalic and atraumatic.  Mouth/Throat: Oropharynx is clear and moist. No oropharyngeal exudate.  No pallor  Eyes: Conjunctivae and EOM are normal. Pupils are equal, round, and reactive to light.  Neck: Normal range of motion. Neck supple.  Cardiovascular: Normal rate, regular rhythm and normal heart sounds.   No murmur heard. Pulmonary/Chest: Effort normal and breath sounds normal. No respiratory distress.  Abdominal: Soft. There is no tenderness. There is no rebound and no  guarding.  Musculoskeletal: Normal range of motion. She exhibits edema.  Mild LLE swelling. No calf tenderness +2 DP and PT pulses  Neurological: She is alert and oriented to person, place, and time. No cranial nerve deficit. She exhibits normal muscle tone. Coordination normal.  CN 2-12 intact, no ataxia on finger to nose, no nystagmus, 5/5 strength throughout, no pronator drift, Romberg negative, normal gait.   Skin: Skin is warm.    ED Course  Procedures (including critical care time)  Labs Reviewed  GLUCOSE, CAPILLARY - Abnormal; Notable for the following:    Glucose-Capillary 166 (*)    All other components within normal limits  CBC WITH DIFFERENTIAL - Abnormal; Notable for the following:    Hemoglobin 9.5 (*)    HCT 31.5 (*)    MCV 70.0 (*)    MCH 21.1 (*)    RDW 17.0 (*)    All other components within normal limits  COMPREHENSIVE METABOLIC PANEL - Abnormal; Notable for the following:    Glucose, Bld 181 (*)    Albumin 3.4 (*)    Total Bilirubin 0.1 (*)    GFR calc non Af Amer 66 (*)    GFR calc Af Amer 77 (*)    All other components within normal limits  D-DIMER, QUANTITATIVE - Abnormal; Notable for the following:    D-Dimer, Quant 0.99 (*)    All other components within normal limits  TROPONIN I  URINALYSIS, ROUTINE W REFLEX MICROSCOPIC  PREGNANCY, URINE  TSH  T4, FREE   Ct Angio Chest Pe W/cm &/or Wo Cm  04/20/2013   *RADIOLOGY REPORT*  Clinical Data: Increasing shortness of breath for the past 3 days. Elevated D-dimer.  CT ANGIOGRAPHY CHEST  Technique:  Multidetector CT imaging of the chest using the standard protocol during bolus administration of intravenous contrast. Multiplanar reconstructed images including MIPs were obtained and reviewed to evaluate the vascular anatomy.  Contrast: OMNIPAQUE IOHEXOL 350 MG/ML SOLN  Comparison: None.  Findings: Normally opacified pulmonary arteries with no pulmonary arterial filling defects seen.  Minimally prominent  interstitial markings.  No lung masses or enlarged lymph nodes.  Thoracic spine degenerative changes.  Unremarkable upper abdomen.  IMPRESSION:  1.  No pulmonary emboli. 2.  Minimal interstitial lung disease.   Original Report Authenticated By: Beckie Salts, M.D.     1. Anemia   2. Weakness       MDM  Diabetic with a 2 week history of fatigue. Nonfocal neurological exam. Abdomen soft with mild suprapubic tenderness which she says is typical of her fibroids. Appears well and nontoxic. No distress.  Hemoglobin 9.5 without comparison. Sugars controlled.  Orthostatics positive. Given IVF and PO fluids. D-dimer positive, CTA negative. Doppler RLE negative for DVT.  HR improved to 90s after fluids.  Question whether patient's anemia is contributing to her fatigue. Thyroid studies sent. She has been evaluated in the past for hysterectomy but deferred her decision. No indication for transfusion at this point. Needs followup with PCP and GYN.  Return precautions discussed.  BP 136/87  Pulse 93  Temp(Src) 98.2 F (36.8 C) (Oral)  Resp 20  SpO2 100%  LMP 04/02/2013    Date: 04/20/2013  Rate: 106  Rhythm: sinus tachycardia  QRS Axis: normal  Intervals: normal  ST/T Wave abnormalities: normal  Conduction Disutrbances:none  Narrative Interpretation:   Old EKG Reviewed: none available    Glynn Octave, MD 04/21/13 [phone]

## 2013-04-20 NOTE — ED Notes (Signed)
Patient returned from CT

## 2013-06-02 ENCOUNTER — Other Ambulatory Visit: Payer: Self-pay | Admitting: Obstetrics and Gynecology

## 2013-06-02 DIAGNOSIS — D259 Leiomyoma of uterus, unspecified: Secondary | ICD-10-CM

## 2013-06-06 ENCOUNTER — Ambulatory Visit (INDEPENDENT_AMBULATORY_CARE_PROVIDER_SITE_OTHER): Payer: 59 | Admitting: Endocrinology

## 2013-06-06 ENCOUNTER — Encounter: Payer: Self-pay | Admitting: Endocrinology

## 2013-06-06 VITALS — BP 110/70 | HR 83 | Resp 10 | Ht 64.5 in | Wt 162.7 lb

## 2013-06-06 DIAGNOSIS — IMO0001 Reserved for inherently not codable concepts without codable children: Secondary | ICD-10-CM

## 2013-06-06 DIAGNOSIS — E785 Hyperlipidemia, unspecified: Secondary | ICD-10-CM | POA: Insufficient documentation

## 2013-06-06 DIAGNOSIS — I10 Essential (primary) hypertension: Secondary | ICD-10-CM

## 2013-06-06 LAB — COMPREHENSIVE METABOLIC PANEL
ALT: 17 U/L (ref 0–35)
CO2: 28 mEq/L (ref 19–32)
Calcium: 9.6 mg/dL (ref 8.4–10.5)
Chloride: 101 mEq/L (ref 96–112)
Creatinine, Ser: 1 mg/dL (ref 0.4–1.2)
GFR: 74.52 mL/min (ref >60.00)
Glucose, Bld: 140 mg/dL — ABNORMAL HIGH (ref 70–99)

## 2013-06-06 LAB — URINALYSIS, ROUTINE W REFLEX MICROSCOPIC
Bilirubin Urine: NEGATIVE
Hgb urine dipstick: NEGATIVE
Ketones, ur: NEGATIVE
RBC / HPF: NONE SEEN (ref 0–?)
Urine Glucose: NEGATIVE
Urobilinogen, UA: 0.2 (ref 0.0–1.0)

## 2013-06-06 LAB — HEMOGLOBIN A1C: Hgb A1c MFr Bld: 9.7 % — ABNORMAL HIGH (ref 4.6–6.5)

## 2013-06-06 LAB — MICROALBUMIN / CREATININE URINE RATIO
Creatinine,U: 208.1 mg/dL
Microalb Creat Ratio: 2.9 mg/g (ref 0.0–30.0)
Microalb, Ur: 6.1 mg/dL — ABNORMAL HIGH (ref 0.0–1.9)

## 2013-06-06 LAB — LIPID PANEL
Cholesterol: 206 mg/dL — ABNORMAL HIGH (ref 0–200)
Total CHOL/HDL Ratio: 4

## 2013-06-06 NOTE — Patient Instructions (Addendum)
Please check blood sugars at least half the time about 2-3 hours after any meal and as directed on waking up. Please bring blood sugar monitor to each visit  BLOOD sugar target 2-3 hours after supper it should be less than at least 180  HUMALOG insulin: This will cover the rise in blood sugar at suppertime. Start taking 6 units just before eating supper daily, may keep the insulin pen that you're using at room temperature needed If the blood sugar after supper is still over 180 may go up 1 unit every day until it is below 180  Stay on the same dose of Lantus at 30 units every day and do  not increase the dose unless blood sugar is over 140 for at least 3 days in a row Stop glimepiride Continued Janumet Start walking regularly at least 20 minutes daily

## 2013-06-06 NOTE — Progress Notes (Signed)
Patient ID: Dana Fowler, female   DOB: 12-Dec-1965, 47 y.o.   MRN: [id number]  SACOYA Fowler is an 47 y.o. female.   Reason for Appointment : Consultation for Type 2 Diabetes  History of Present Illness Reason for visit endocrine consult.           Diagnosis: Type 2 diabetes mellitus, date of diagnosis: 15 years ago with blood sugar 500 and symptoms of blurred vision   Monitors blood glucose: Twice a day.         Glucometer:  unknown     Blood Glucose readings: Before breakfast: 95-150, hs 200-300 but does not remember exact reading. Glucose in the lab with PCP was 241 this month            Hypoglycemia frequency:  occasionally early am but she feels bad when blood sugar is in the 90s, will treat the low sugar with food intake         Meals: 3 meals per day.          Physical activity: exercise: Occasionally walking, no sick program           Dietician visit: Most recent: Only at diagnosis          Currently does not follow any specific diet but is usually avoiding regular soft drinks Retinal exam: Most recent: > 1 Year ago            Oral hypoglycemic drugs the patient is taking are: Amaryl and Janumet Side effects from medications have been: None Compliance with the medical regimen has been  fairly good The last HbgA1c was >13 done on 03/31/13, previously on 12/16/12 it was 10.1 and has been consistently over 10% since 2008  RECENT history: The patient says that she has been treated with insulin for more than one year although detailed records are not available. Previously has been on various drugs including Janumet Her blood sugar control appears to be persistently poor as judged by her A1c which has been consistently over 11% since 2011 She was also given Amaryl about a year ago without much benefit She has only been treated with Lantus insulin She said that her Lantus dose is given at bedtime and she is adjusting dose based on the blood sugar at bedtime which is consistently  high Generally taking between 26-34 units of Lantus and a doing this at night regularly    Medication List       This list is accurate as of: 06/06/13 11:59 PM.  Always use your most recent med list.               ferrous fumarate 325 (106 FE) MG Tabs  Commonly known as:  HEMOCYTE - 106 mg FE  Take 1 tablet by mouth.     glimepiride 4 MG tablet  Commonly known as:  AMARYL  Take 4 mg by mouth 2 (two) times daily.     insulin glargine 100 UNIT/ML injection  Commonly known as:  LANTUS  Inject 26-34 Units into the skin at bedtime. Depends on blood glucose when checking at night     olmesartan-hydrochlorothiazide 40-12.5 MG per tablet  Commonly known as:  BENICAR HCT  Take 1 tablet by mouth daily.     ONE TOUCH ULTRA TEST test strip  Generic drug:  glucose blood     sitaGLIPtan-metformin 50-1000 MG per tablet  Commonly known as:  JANUMET  Take 1 tablet by mouth 2 (two) times daily with a  meal.        Allergies:  Allergies  Allergen Reactions  . Penicillins     Past Medical History  Diagnosis Date  . Diabetes mellitus   . Hypertension   . Pelvic pain in female   . Dysmenorrhea   . Fibroids   . Menorrhagia   . Yeast vaginitis   . Sickle cell trait     Past Surgical History  Procedure Laterality Date  . Hysteroscopy w/d&c      Family History  Problem Relation Age of Onset  . Hypertension Maternal Grandfather   . Diabetes Father   . Diabetes Mother   . Hypertension Sister    Social History:  reports that she has never smoked. She does not have any smokeless tobacco history on file. She reports that she does not drink alcohol or use illicit drugs.    Review of Systems -   Hyperlipidemia: Most recent lipids showed LDL  94 in 1/14 and is not on any medications She has had long-standing hypertension treated with Benicar HCT   No history of leg edema No history of shortness of breath No history of abdominal pain or diarrhea    She has secretions during  the night or morning from her throat but no sneezing or cough, not on any medications  No  history of numbness or tingling in her feet or toes She has had excessive uterine bleeding and has been told to have fibroids, is pending  surgery   Office Visit on 06/06/2013  Component Date Value Range Status  . Color, Urine 06/06/2013 LT. YELLOW  Yellow;Lt. Yellow Final  . APPearance 06/06/2013 CLEAR  Clear Final  . Specific Gravity, Urine 06/06/2013 1.020  1.000-1.030 Final  . pH 06/06/2013 5.5  5.0 - 8.0 Final  . Total Protein, Urine 06/06/2013 NEGATIVE  Negative Final  . Urine Glucose 06/06/2013 NEGATIVE  Negative Final  . Ketones, ur 06/06/2013 NEGATIVE  Negative Final  . Bilirubin Urine 06/06/2013 NEGATIVE  Negative Final  . Hgb urine dipstick 06/06/2013 NEGATIVE  Negative Final  . Urobilinogen, UA 06/06/2013 0.2  0.0 - 1.0 Final  . Leukocytes, UA 06/06/2013 NEGATIVE  Negative Final  . Nitrite 06/06/2013 NEGATIVE  Negative Final  . WBC, UA 06/06/2013 0-2/hpf  0-2/hpf Final  . RBC / HPF 06/06/2013 none seen  0-2/hpf Final  . Squamous Epithelial / LPF 06/06/2013 Few(5-10/hpf)  Rare(0-4/hpf) Final  . Bacteria, UA 06/06/2013 Rare(<10/hpf)  None Final  . Microalb, Ur 06/06/2013 6.1* 0.0 - 1.9 mg/dL Final  . Creatinine,U 56/21/3086 208.1   Final  . Microalb Creat Ratio 06/06/2013 2.9  0.0 - 30.0 mg/g Final  . Hemoglobin A1C 06/06/2013 9.7* 4.6 - 6.5 % Final   Glycemic Control Guidelines for People with Diabetes:Non Diabetic:  <6%Goal of Therapy: <7%Additional Action Suggested:  >8%   . Cholesterol 06/06/2013 206* 0 - 200 mg/dL Final   ATP III Classification       Desirable:  < 200 mg/dL               Borderline High:  200 - 239 mg/dL          High:  > = 578 mg/dL  . Triglycerides 06/06/2013 71.0  0.0 - 149.0 mg/dL Final   Normal:  <469 mg/dLBorderline High:  150 - 199 mg/dL  . HDL 06/06/2013 58.10  >39.00 mg/dL Final  . VLDL 62/95/2841 14.2  0.0 - 40.0 mg/dL Final  . Total CHOL/HDL Ratio  06/06/2013 4   Final  Men          Women1/2 Average Risk     3.4          3.3Average Risk          5.0          4.42X Average Risk          9.6          7.13X Average Risk          15.0          11.0                      . Sodium 06/06/2013 137  135 - 145 mEq/L Final  . Potassium 06/06/2013 3.8  3.5 - 5.1 mEq/L Final  . Chloride 06/06/2013 101  96 - 112 mEq/L Final  . CO2 06/06/2013 28  19 - 32 mEq/L Final  . Glucose, Bld 06/06/2013 140* 70 - 99 mg/dL Final  . BUN 16/08/9603 14  6 - 23 mg/dL Final  . Creatinine, Ser 06/06/2013 1.0  0.4 - 1.2 mg/dL Final  . Total Bilirubin 06/06/2013 0.5  0.3 - 1.2 mg/dL Final  . Alkaline Phosphatase 06/06/2013 42  39 - 117 U/L Final  . AST 06/06/2013 17  0 - 37 U/L Final  . ALT 06/06/2013 17  0 - 35 U/L Final  . Total Protein 06/06/2013 7.4  6.0 - 8.3 g/dL Final  . Albumin 54/07/8118 3.6  3.5 - 5.2 g/dL Final  . Calcium 14/78/2956 9.6  8.4 - 10.5 mg/dL Final  . GFR 21/30/8657 74.52  >60.00 mL/min Final  . Direct LDL 06/06/2013 134.4   Final   Optimal:  <100 mg/dLNear or Above Optimal:  100-129 mg/dLBorderline High:  130-159 mg/dLHigh:  160-189 mg/dLVery High:  >190 mg/dL    Physical Examination:  BP 110/70  Pulse 83  Resp 10  Ht 5' 4.5" (1.638 m)  Wt 162 lb 11.2 oz (73.8 kg)  BMI 27.51 kg/m2  SpO2 96%  LMP 05/23/2013  GENERAL:       Patient appears to have  abdominal obesity.   HEENT:         Eye exam shows normal external appearance. Fundus exam shows no retinopathy. Oral exam shows normal mucosa .  NECK:         General:  Neck exam shows no lymphadenopathy. Thyroid is not enlarged and no nodules felt.   LUNGS:         Chest is symmetrical. Lungs are clear to auscultation.Marland Kitchen   HEART:         Heart sounds:  S1 and S2 are normal. No murmurs or clicks heard., no S3 or S4.   ABDOMEN:         General:  There is no distention present. Liver and spleen are not palpable. No other mass or tenderness present.  EXTREMITIES:     There is no  edema. No skin lesions present.Marland Kitchen  NEUROLOGICAL:        Vibration sense is nearly normal in toes. Ankle jerks are absent bilaterally and biceps reflexes 1+           Diabetic foot exam:            Inspection  Normal                    Monofilament  Normal  MUSCULOSKELETAL:       There is no enlargement or deformity of the  joints. Spine is normal to inspection.Marland Kitchen   PEDAL pulses: normal  SKIN:       No rash or lesions of concern and no abnormal pigmentation .        ASSESSMENT:  Diabetes type 2, uncontrolled - 250.02     Her diabetes appear to be persistently poorly controlled. Because of her minimal obesity and long-standing history of 15 years she is likely to be insulin deficient She appears to be not responding to oral agents like Amaryl and Janumet Currently do not have any details of her home glucose monitoring although her fasting blood sugars appear to be controlled with bedtime Lantus insulin  Recently she has had an A1c of 13% and  is likely to have mostly postprandial hyperglycemia throughout the day. Currently  she is adjusting Lantus inappropriately based on bedtime reading Also she has not had any formal meal planning education and is not exercising  At present does not appear to have any significant diabetes complications   HYPERTENSION: Appears to be well controlled and will need assessment of microalbumin   PLAN:    BLOOD SUGAR TESTING: Check blood sugar in the morning every day and  2 hours after one of the meals by rotation daily.  She needs to bring blood sugar monitor to each visit   EXERCISE: Walking or similar aerobic exercise at least 20 minutes every other day   MEALTIME insulin: Start Humalog 6-7 units before at least dinner and if eating a larger meal at lunch also. Discussed in detail how to inject this, timing of the injection, blood sugar targets after meals and dosage adjustment.  She will need to have her postprandial readings at least under 180 and may go up  on the dose if blood sugars are persistently high She will be sent for education with the diabetes nurse and further adjustment can be done based on her readings We'll also consider her doing mealtime insulin based on carbohydrate counting once she has had more education Given brochures on Humalog and meal planning as well as general information on diabetes  BASAL insulin: Advised her to not adjust the dose based on bedtime reading but only every 3 days based on fasting blood sugars, have given her a flowsheet to do this Meantime she will start with 30 units every day She will stop her Amaryl as this is ineffective but would consider using Actoplusmet instead of Janumet for her insulin resistance   Her lipids will need to be followed at least annually Also recommended annual exams, can wait till her blood sugars are better controlled  Total visit time including counseling = 55 minutes  Dajae Kizer 06/07/2013, 12:21 PM

## 2013-06-07 ENCOUNTER — Encounter: Payer: Self-pay | Admitting: Endocrinology

## 2013-06-08 ENCOUNTER — Telehealth: Payer: Self-pay | Admitting: Endocrinology

## 2013-06-08 ENCOUNTER — Ambulatory Visit
Admission: RE | Admit: 2013-06-08 | Discharge: 2013-06-08 | Disposition: A | Discharge status: Home / Self Care | Payer: 59 | Source: Ambulatory Visit | Attending: Obstetrics and Gynecology | Admitting: Obstetrics and Gynecology

## 2013-06-08 DIAGNOSIS — D259 Leiomyoma of uterus, unspecified: Secondary | ICD-10-CM

## 2013-06-08 MED ORDER — GADOBENATE DIMEGLUMINE 529 MG/ML IV SOLN
15.0000 mL | Freq: Once | INTRAVENOUS | Status: AC | PRN
  Administered 2013-06-08: 15 mL via INTRAVENOUS

## 2013-06-08 NOTE — Telephone Encounter (Signed)
Dr. Lucianne Muss, please see below and advise

## 2013-06-08 NOTE — Telephone Encounter (Signed)
Patient had an appointment on Tuesday and started taking Humalog 6 units with her Janumet.  She states that "its not working".  She would like to know if it is safe for her to take her glimepiride with her Humalog?

## 2013-06-08 NOTE — Telephone Encounter (Signed)
She needs to give me details of her blood sugars, may need higher dose of Humalog

## 2013-06-08 NOTE — Telephone Encounter (Signed)
Left message on machine for patient to return our call with more information concerning her blood glucose levels

## 2013-06-09 NOTE — Telephone Encounter (Signed)
Patient agrees and will increase Humalog to 12 units.  Her morning glucose level is not above 200.  She would like to know the result of her last labs done.

## 2013-06-09 NOTE — Telephone Encounter (Signed)
She will need to increase to Humalog to 12 units Also if she increasing her Lantus based on her blood sugar waking up? If her blood sugar in the morning is still over 200 she will need to increase her Lantus by 4 units  Glimepiride will not be effective now but it is okay to take it if she wants

## 2013-06-09 NOTE — Telephone Encounter (Signed)
Patient states yesterday she took 8 units of Humalog at her largest meal yesterday which was lunch.  After 3 hours her glucose was still 300.  She would like to know if it is safe to take her glimepiride?

## 2013-06-27 ENCOUNTER — Encounter: Payer: Self-pay | Admitting: Endocrinology

## 2013-06-27 ENCOUNTER — Other Ambulatory Visit: Payer: Self-pay | Admitting: *Deleted

## 2013-06-27 ENCOUNTER — Ambulatory Visit (INDEPENDENT_AMBULATORY_CARE_PROVIDER_SITE_OTHER): Payer: 59 | Admitting: Endocrinology

## 2013-06-27 VITALS — BP 124/82 | HR 100 | Temp 98.9°F | Resp 12 | Ht 66.0 in | Wt 162.8 lb

## 2013-06-27 DIAGNOSIS — E785 Hyperlipidemia, unspecified: Secondary | ICD-10-CM

## 2013-06-27 MED ORDER — PRAVASTATIN SODIUM 40 MG PO TABS: 40.0000 mg | ORAL_TABLET | Freq: Every day | ORAL | Status: DC

## 2013-06-27 MED ORDER — INSULIN LISPRO 100 UNIT/ML (KWIKPEN): 14.0000 [IU] | PEN_INJECTOR | Freq: Once | SUBCUTANEOUS | Status: DC

## 2013-06-27 NOTE — Progress Notes (Signed)
Patient ID: Dana Fowler, female   DOB: 06/17/1966, 47 y.o.   MRN: [id number]   Reason for Appointment : Followup for Type 2 Diabetes  History of Present Illness          Diagnosis: Type 2 diabetes mellitus, date of diagnosis: 15 years ago with blood sugar 500 and symptoms of blurred vision   PAST history: She has been treated with insulin for more than one year although detailed records are not available. Previously has been on various drugs including Janumet. She was also given Amaryl about a year ago without much benefit Her blood sugar control had been  persistently poor as judged by her A1c which has been consistently over 11% since 2011 Prior to her visit here she was taking variable doses of Lantus at bedtime based on her bedtime reading  RECENT history: On the initial consultation she was only taking Lantus insulin and she was told to start Humalog at her main meal also She was told to start with 10 units but her sugars were relatively high after eating her dose was increased to 14 She was also told to continue her Lantus. However because her sugars were coming down to normal at that time about a week ago she stopped taking her Lantus completely She has been only taking her Humalog at lunch when she is eating a large meal Blood sugars are markedly increased now with the highest reading of 103 last evening at suppertime  Monitors blood glucose:  2-3 times a day.         Glucometer:  One Touch     Blood Glucose readings: Before breakfast: 94-227 median about 190, nonfasting 82-403 with lowest reading sent bedtime with median 144 and highest median 321 at suppertime          Hypoglycemia frequency:  none    Meals: 3 meals per day.          Physical activity: exercise: Occasionally walking      Dietician visit: Most recent: Only at diagnosis          Currently does not follow any specific diet but is usually avoiding regular soft drinks Retinal exam: Most recent: > 1 Year ago             Oral hypoglycemic drugs the patient is taking are: Janumet Side effects from medications have been: None Compliance with the medical regimen has been  fairly good The last HbgA1c was 9.7 and 05/2013, previously was >13 done on 03/31/13, before that on 12/16/12 it was 10.1 and has been consistently over 10% since 2008    Medication List       This list is accurate as of: 06/27/13  9:44 AM.  Always use your most recent med list.               ferrous fumarate 325 (106 FE) MG Tabs tablet  Commonly known as:  HEMOCYTE - 106 mg FE  Take 1 tablet by mouth.     glimepiride 4 MG tablet  Commonly known as:  AMARYL  Take 4 mg by mouth 2 (two) times daily.     HUMALOG KWIKPEN 100 UNIT/ML Sopn  Generic drug:  insulin lispro  Inject 14 Units into the skin.     insulin glargine 100 UNIT/ML injection  Commonly known as:  LANTUS  Inject 26-34 Units into the skin at bedtime. Depends on blood glucose when checking at night     olmesartan-hydrochlorothiazide 40-12.5 MG per tablet  Commonly known as:  BENICAR HCT  Take 1 tablet by mouth daily.     ONE TOUCH ULTRA TEST test strip  Generic drug:  glucose blood     sitaGLIPtan-metformin 50-1000 MG per tablet  Commonly known as:  JANUMET  Take 1 tablet by mouth 2 (two) times daily with a meal.        Allergies:  Allergies  Allergen Reactions  . Penicillins     Past Medical History  Diagnosis Date  . Diabetes mellitus   . Hypertension   . Pelvic pain in female   . Dysmenorrhea   . Fibroids   . Menorrhagia   . Yeast vaginitis   . Sickle cell trait     Past Surgical History  Procedure Laterality Date  . Hysteroscopy w/d&c      Family History  Problem Relation Age of Onset  . Hypertension Maternal Grandfather   . Diabetes Father   . Diabetes Mother   . Hypertension Sister   . Heart disease Neg Hx    Social History:  reports that she has never smoked. She does not have any smokeless tobacco history on file. She reports  that she does not drink alcohol or use illicit drugs.    Review of Systems -   Hyperlipidemia: Most recent lipids showed LDL  94 in 1/14 and is not on any medications She has had long-standing hypertension treated with Benicar HCT   She is asking about pain running down from her left hip down to the lower leg No  history of numbness or tingling in her feet or toes She has had excessive uterine bleeding and has been told to have fibroids, is pending  surgery   No visits with results within 1 Week(s) from this visit. Latest known visit with results is:  Office Visit on 06/06/2013  Component Date Value Range Status  . Color, Urine 06/06/2013 LT. YELLOW  Yellow;Lt. Yellow Final  . APPearance 06/06/2013 CLEAR  Clear Final  . Specific Gravity, Urine 06/06/2013 1.020  1.000-1.030 Final  . pH 06/06/2013 5.5  5.0 - 8.0 Final  . Total Protein, Urine 06/06/2013 NEGATIVE  Negative Final  . Urine Glucose 06/06/2013 NEGATIVE  Negative Final  . Ketones, ur 06/06/2013 NEGATIVE  Negative Final  . Bilirubin Urine 06/06/2013 NEGATIVE  Negative Final  . Hgb urine dipstick 06/06/2013 NEGATIVE  Negative Final  . Urobilinogen, UA 06/06/2013 0.2  0.0 - 1.0 Final  . Leukocytes, UA 06/06/2013 NEGATIVE  Negative Final  . Nitrite 06/06/2013 NEGATIVE  Negative Final  . WBC, UA 06/06/2013 0-2/hpf  0-2/hpf Final  . RBC / HPF 06/06/2013 none seen  0-2/hpf Final  . Squamous Epithelial / LPF 06/06/2013 Few(5-10/hpf)  Rare(0-4/hpf) Final  . Bacteria, UA 06/06/2013 Rare(<10/hpf)  None Final  . Microalb, Ur 06/06/2013 6.1* 0.0 - 1.9 mg/dL Final  . Creatinine,U 47/82/9562 208.1   Final  . Microalb Creat Ratio 06/06/2013 2.9  0.0 - 30.0 mg/g Final  . Hemoglobin A1C 06/06/2013 9.7* 4.6 - 6.5 % Final   Glycemic Control Guidelines for People with Diabetes:Non Diabetic:  <6%Goal of Therapy: <7%Additional Action Suggested:  >8%   . Cholesterol 06/06/2013 206* 0 - 200 mg/dL Final   ATP III Classification       Desirable:   < 200 mg/dL               Borderline High:  200 - 239 mg/dL          High:  > = 130  mg/dL  . Triglycerides 06/06/2013 71.0  0.0 - 149.0 mg/dL Final   Normal:  <161 mg/dLBorderline High:  150 - 199 mg/dL  . HDL 06/06/2013 58.10  >39.00 mg/dL Final  . VLDL 09/60/4540 14.2  0.0 - 40.0 mg/dL Final  . Total CHOL/HDL Ratio 06/06/2013 4   Final                  Men          Women1/2 Average Risk     3.4          3.3Average Risk          5.0          4.42X Average Risk          9.6          7.13X Average Risk          15.0          11.0                      . Sodium 06/06/2013 137  135 - 145 mEq/L Final  . Potassium 06/06/2013 3.8  3.5 - 5.1 mEq/L Final  . Chloride 06/06/2013 101  96 - 112 mEq/L Final  . CO2 06/06/2013 28  19 - 32 mEq/L Final  . Glucose, Bld 06/06/2013 140* 70 - 99 mg/dL Final  . BUN 98/09/9146 14  6 - 23 mg/dL Final  . Creatinine, Ser 06/06/2013 1.0  0.4 - 1.2 mg/dL Final  . Total Bilirubin 06/06/2013 0.5  0.3 - 1.2 mg/dL Final  . Alkaline Phosphatase 06/06/2013 42  39 - 117 U/L Final  . AST 06/06/2013 17  0 - 37 U/L Final  . ALT 06/06/2013 17  0 - 35 U/L Final  . Total Protein 06/06/2013 7.4  6.0 - 8.3 g/dL Final  . Albumin 82/95/6213 3.6  3.5 - 5.2 g/dL Final  . Calcium 08/65/7846 9.6  8.4 - 10.5 mg/dL Final  . GFR 96/29/5284 74.52  >60.00 mL/min Final  . Direct LDL 06/06/2013 134.4   Final   Optimal:  <100 mg/dLNear or Above Optimal:  100-129 mg/dLBorderline High:  130-159 mg/dLHigh:  160-189 mg/dLVery High:  >190 mg/dL    Physical Examination:  BP 124/82  Pulse 100  Temp(Src) 98.9 F (37.2 C)  Resp 12  Ht 5\' 6"  (1.676 m)  Wt 162 lb 12.8 oz (73.846 kg)  BMI 26.29 kg/m2  SpO2 98%  LMP 05/20/2013  GENERAL:       Patient appears to have  abdominal obesity.   HEENT:         Eye exam shows normal external appearance. Fundus exam shows no retinopathy. Oral exam shows normal mucosa .  NECK:         General:  Neck exam shows no lymphadenopathy. Thyroid is not enlarged  and no nodules felt.   LUNGS:         Chest is symmetrical. Lungs are clear to auscultation.Marland Kitchen   HEART:         Heart sounds:  S1 and S2 are normal. No murmurs or clicks heard., no S3 or S4.   ABDOMEN:         General:  There is no distention present. Liver and spleen are not palpable. No other mass or tenderness present.  EXTREMITIES:     There is no edema. No skin lesions present.Marland Kitchen  NEUROLOGICAL:        Vibration sense is nearly  normal in toes. Ankle jerks are absent bilaterally and biceps reflexes 1+           Diabetic foot exam:            Inspection  Normal                    Monofilament  Normal  MUSCULOSKELETAL:       There is no enlargement or deformity of the joints. Spine is normal to inspection.Marland Kitchen   PEDAL pulses: normal  SKIN:       No rash or lesions of concern and no abnormal pigmentation .        ASSESSMENT/PLAN:  Diabetes type 2, uncontrolled - 250.02     Her diabetes is again poorly controlled since she did not understand the directions for her insulin and stopped her Lantus.  Her only good readings were at bedtime when she was taking Lantus and Humalog but now without her Lantus her blood sugars are high throughout the day Since her blood sugars are as high as 403 she will need to use a basal bolus regimen Discussed in detail the actions of basal and bolus insulins Discussed adjusting Lantus based on fasting blood sugar trend over 3 days To avoid confusion she will start taking Lantus in the morning instead of bedtime Also will start taking the Humalog with every meal and discussed postprandial targets  She has been scheduled for diabetes education and will have her insulin reviewed at that time also along with meal planning   Her lipids will need to be treated with a statin drug because of her diabetes and she will start Pravachol Discussed that since her diet is generally low fat she will need a medication to reduce endogenous production Also recommended annual exams, can  wait till her blood sugars are better controlled  She will discuss her leg pain with PCP  Total visit time including counseling = 25 minutes  Korianna Washer 06/27/2013, 9:44 AM

## 2013-06-27 NOTE — Patient Instructions (Addendum)
LANTUS insulin: This insulin provides blood sugar control for up to 24 hours.  Start with 20 units IN AM daily and increase by 2 units every 3 days until the waking up sugars are under 130. Then continue the same dose. If blood sugar is under 90 for 2 days in a row, reduce the dose by 2 units.   HUMALOG 10 UNITS AT LUNCH AND 12 AT DINNER INCREASE HUMALOG AT [facility] MEAL ONLY IF SUGAR > 200 AFTER  Please check blood sugars at least half the time about 2 hours after any meal and as directed on waking up. Please bring blood sugar monitor to each visit  PRAVACHOL 40MG  IN AM

## 2013-07-03 ENCOUNTER — Other Ambulatory Visit: Payer: Self-pay | Admitting: *Deleted

## 2013-07-03 MED ORDER — INSULIN LISPRO 100 UNIT/ML (KWIKPEN): 14.0000 [IU] | PEN_INJECTOR | Freq: Once | SUBCUTANEOUS | Status: DC

## 2013-07-03 MED ORDER — GLUCOSE BLOOD VI STRP: ORAL_STRIP | Status: DC

## 2013-07-06 ENCOUNTER — Encounter: Payer: 59 | Attending: Endocrinology | Admitting: *Deleted

## 2013-07-06 ENCOUNTER — Encounter: Payer: Self-pay | Admitting: *Deleted

## 2013-07-06 VITALS — Ht 64.0 in | Wt 163.0 lb

## 2013-07-06 DIAGNOSIS — Z713 Dietary counseling and surveillance: Secondary | ICD-10-CM | POA: Insufficient documentation

## 2013-07-06 DIAGNOSIS — E119 Type 2 diabetes mellitus without complications: Secondary | ICD-10-CM | POA: Insufficient documentation

## 2013-07-07 NOTE — Progress Notes (Deleted)
Subjective:     Patient ID: Dana Fowler, female   DOB: 09/10/1966, 46 y.o.   MRN: [id number]  HPI   Review of Systems     Objective:   Physical Exam     Assessment:     ***    Plan:     ***      

## 2013-07-10 NOTE — Progress Notes (Deleted)
Subjective:     Patient ID: Dana Fowler, female   DOB: 27-Sep-1966, 47 y.o.   MRN: [id number]  HPI   Review of Systems     Objective:   Physical Exam     Assessment:     ***    Plan:     ***

## 2013-07-11 ENCOUNTER — Other Ambulatory Visit: Payer: Self-pay | Admitting: *Deleted

## 2013-07-11 MED ORDER — INSULIN LISPRO 100 UNIT/ML (KWIKPEN): PEN_INJECTOR | SUBCUTANEOUS | Status: DC

## 2013-07-18 NOTE — Progress Notes (Signed)
Appt start time: 0800 end time:  0930.  Assessment:  Patient was seen on  07/06/13 for individual diabetes education. Dana Fowler was diagnosed with Type I DM at age 47. She has been a Patient of Dr. Lucianne Muss since 2012. She lives alone, does her own grocery shopping and cooking. She has a sedentary job at a [employer]. Working hours are 1330-2200 daily. Jala tests her glucose 4Xdaily. This morning fasting was 136. She has a 7day avg of 198, 14 day avg of 232 and a 30 day avg of 217.  Current HbA1c: 9.7%  MEDICATIONS: See list: Due to her work schedule she takesLantus 24units around noon, Humalog 14u approx 1715. Janumet 50/1000 BID, Pravastatin 40mg  once daily   DIETARY INTAKE:  Usual eating pattern includes 3 meals and 1 snacks per day.  Everyday foods include  large amount of rice with good variety of vegetables, fruit and protein .    24-hr recall:  B ( AM): Rice, broccoli, chicken, powdered milk  Snk ( AM): none  L ( PM): Rice, vegetables, hot dog, chili, relish Snk ( PM): fruit D ( PM): french fries, chicken sandwich Snk ( PM): none Beverages: water, powdered milk  Usual physical activity: walks one hour 2-3X per week and during breaks at work for approximately 5 minutes   Progress Towards Goal(s):  In progress.   Nutritional Diagnosis:  NB-1.1 Food and nutrition-related knowledge deficit As related to elevated glucose levels.  As evidenced by A1c 9.7%.    Intervention:  Nutrition counseling provided.  Discussed diabetes disease process and treatment options.  Discussed physiology of diabetes and role of obesity on insulin resistance.  Encouraged moderate weight reduction to improve glucose levels.  Discussed role of medications and diet in glucose control  Provided education on macronutrients on glucose levels.  Provided education on carb counting, importance of regularly scheduled meals/snacks, and meal planning  Discussed effects of physical activity on glucose  levels and long-term glucose control.  Recommended 150 minutes of physical activity/week.  Reviewed patient medications.  Discussed role of medication on blood glucose and possible side effects  Discussed blood glucose monitoring and interpretation.  Discussed recommended target ranges and individual ranges.    Described short-term complications: hyper- and hypo-glycemia.  Discussed causes,symptoms, and treatment options.  Discussed prevention, detection, and treatment of long-term complications.  Discussed the role of prolonged elevated glucose levels on body systems.  Handouts given during visit include: Living Well with Diabetes Carb Counting and Food Label handouts Meal Plan Card   Barriers to learning/adherance to lifestyle change: culture foods  Diabetes self-care support plan:   [facility] support group  Regular visits with physician  Monitoring/Evaluation:  Dietary intake, exercise, glucose, and body weight , return prn.

## 2013-07-25 ENCOUNTER — Ambulatory Visit: Payer: 59 | Admitting: Endocrinology

## 2013-08-03 ENCOUNTER — Other Ambulatory Visit: Payer: Self-pay | Admitting: *Deleted

## 2013-08-03 MED ORDER — INSULIN LISPRO 100 UNIT/ML (KWIKPEN): PEN_INJECTOR | SUBCUTANEOUS | Status: AC

## 2013-08-24 ENCOUNTER — Encounter (HOSPITAL_COMMUNITY): Payer: Self-pay | Admitting: Pharmacist

## 2013-08-25 ENCOUNTER — Other Ambulatory Visit: Payer: Self-pay | Admitting: Obstetrics and Gynecology

## 2013-08-29 ENCOUNTER — Encounter (HOSPITAL_COMMUNITY)
Admission: RE | Admit: 2013-08-29 | Discharge: 2013-08-29 | Disposition: A | Discharge status: Home / Self Care | Payer: 59 | Source: Ambulatory Visit | Attending: Obstetrics and Gynecology | Admitting: Obstetrics and Gynecology

## 2013-08-29 ENCOUNTER — Encounter (HOSPITAL_COMMUNITY): Payer: Self-pay

## 2013-08-29 DIAGNOSIS — Z01812 Encounter for preprocedural laboratory examination: Secondary | ICD-10-CM | POA: Insufficient documentation

## 2013-08-29 HISTORY — DX: Anemia, unspecified: D64.9

## 2013-08-29 LAB — BASIC METABOLIC PANEL
CO2: 25 mEq/L (ref 19–32)
Calcium: 10.2 mg/dL (ref 8.4–10.5)
Chloride: 99 mEq/L (ref 96–112)
Glucose, Bld: 244 mg/dL — ABNORMAL HIGH (ref 70–99)
Potassium: 4.2 mEq/L (ref 3.5–5.1)
Sodium: 137 mEq/L (ref 135–145)

## 2013-08-29 LAB — CBC
Hemoglobin: 8.4 g/dL — ABNORMAL LOW (ref 12.0–15.0)
MCH: 19.9 pg — ABNORMAL LOW (ref 26.0–34.0)
Platelets: 355 10*3/uL (ref 150–400)
RBC: 4.22 MIL/uL (ref 3.87–5.11)
WBC: 5.3 10*3/uL (ref 4.0–10.5)

## 2013-08-29 NOTE — Pre-Procedure Instructions (Signed)
Dr. Rodman Pickle made aware of patient's increased glucose level 244 and decreased  Hgb 8.4.  Dr. Rodman Pickle advise me to call pt to follow up prior to surgery with her endocrinologist or primary care provider who regulates diabetic medications.  Also Dr. Rodman Pickle ordered type and screen and repeat CBC day of surgery.  Labs placed in [facility].  Left message on pts voicemail in reference to following up with her doctor about increase glucose level and informed pts about risk of improper wound healing if glucose levels are not well maintained.

## 2013-08-29 NOTE — Patient Instructions (Addendum)
Your procedure is scheduled on: 09/05/2013  Enter through the Maternity Admissions of [facility] at: 0600AM  Pick up the phone at the desk and dial [phone].  Call this number if you have problems the morning of surgery: [phone].  Remember: Do NOT eat food: AFTER MIDNIGHT 09/04/2013 Do NOT drink clear liquids after: AFTER MIDNIGHT 09/04/2013 Take these medicines the morning of surgery with a SIP OF WATER: BENICAR Do not take Janumet after 09/03/2013 *TAKE 1/2 USUAL EVENING INSULIN DOSE 09/04/2013  Do NOT wear jewelry (body piercing), make-up, or nail polish. Do NOT wear lotions, powders, or perfumes.  You may wear deoderant. Do NOT shave for 48 hours prior to surgery. Do NOT bring valuables to the hospital. Contacts, dentures, or bridgework may not be worn into surgery. Leave suitcase in car.  After surgery it may be brought to your room.  For patients admitted to the hospital, checkout time is 11:00 AM the day of discharge.

## 2013-09-04 ENCOUNTER — Encounter (HOSPITAL_COMMUNITY): Payer: Self-pay | Admitting: Anesthesiology

## 2013-09-04 MED ORDER — CIPROFLOXACIN IN D5W 400 MG/200ML IV SOLN
400.0000 mg | INTRAVENOUS | Status: AC
  Administered 2013-09-05: 400 mg via INTRAVENOUS
  Filled 2013-09-04: qty 200

## 2013-09-04 MED ORDER — CLINDAMYCIN PHOSPHATE 900 MG/50ML IV SOLN
900.0000 mg | INTRAVENOUS | Status: AC
  Administered 2013-09-05: 900 mg via INTRAVENOUS
  Filled 2013-09-04: qty 50

## 2013-09-04 NOTE — Anesthesia Preprocedure Evaluation (Addendum)
Anesthesia Evaluation  Patient identified by MRN, date of birth, ID band Patient awake    Reviewed: Allergy & Precautions, H&P , NPO status , Patient's Chart, lab work & pertinent test results  Airway Mallampati: II TM Distance: >3 FB Neck ROM: Full    Dental no notable dental hx. (+) Teeth Intact   Pulmonary neg pulmonary ROS,  breath sounds clear to auscultation  Pulmonary exam normal       Cardiovascular hypertension, Pt. on medications Rhythm:Regular Rate:Normal     Neuro/Psych negative neurological ROS  negative psych ROS   GI/Hepatic   Endo/Other  diabetes, Well Controlled, Type 2, Insulin Dependent and Oral Hypoglycemic AgentsHyperlipidemia   Renal/GU   negative genitourinary   Musculoskeletal negative musculoskeletal ROS (+)   Abdominal (+) - obese,   Peds  Hematology  (+) Blood dyscrasia, Sickle cell trait and anemia ,   Anesthesia Other Findings   Reproductive/Obstetrics Endometrial Mass Fibroid uterus Dysmenorrhea Menorrhagia                          Anesthesia Physical Anesthesia Plan  ASA: II  Anesthesia Plan: General   Post-op Pain Management:    Induction: Intravenous  Airway Management Planned: Oral ETT  Additional Equipment: Arterial line  Intra-op Plan:   Post-operative Plan: Extubation in OR  Informed Consent: I have reviewed the patients History and Physical, chart, labs and discussed the procedure including the risks, benefits and alternatives for the proposed anesthesia with the patient or authorized representative who has indicated his/her understanding and acceptance.   Dental advisory given  Plan Discussed with: CRNA, Anesthesiologist and Surgeon  Anesthesia Plan Comments: (2nd IV and A-line after induction.)        Anesthesia Quick Evaluation

## 2013-09-05 ENCOUNTER — Encounter (HOSPITAL_COMMUNITY): Payer: Self-pay | Admitting: *Deleted

## 2013-09-05 ENCOUNTER — Ambulatory Visit (HOSPITAL_COMMUNITY): Payer: 59 | Admitting: Anesthesiology

## 2013-09-05 ENCOUNTER — Encounter (HOSPITAL_COMMUNITY): Payer: 59 | Admitting: Anesthesiology

## 2013-09-05 ENCOUNTER — Encounter (HOSPITAL_COMMUNITY)
Admission: RE | Disposition: A | Discharge status: Home / Self Care | Payer: Self-pay | Source: Ambulatory Visit | Attending: Obstetrics and Gynecology

## 2013-09-05 ENCOUNTER — Ambulatory Visit (HOSPITAL_COMMUNITY)
Admission: RE | Admit: 2013-09-05 | Discharge: 2013-09-06 | Disposition: A | Discharge status: Home / Self Care | Payer: 59 | Source: Ambulatory Visit | Attending: Obstetrics and Gynecology | Admitting: Obstetrics and Gynecology

## 2013-09-05 DIAGNOSIS — N840 Polyp of corpus uteri: Secondary | ICD-10-CM | POA: Insufficient documentation

## 2013-09-05 DIAGNOSIS — D252 Subserosal leiomyoma of uterus: Secondary | ICD-10-CM | POA: Insufficient documentation

## 2013-09-05 DIAGNOSIS — E785 Hyperlipidemia, unspecified: Secondary | ICD-10-CM

## 2013-09-05 DIAGNOSIS — Z23 Encounter for immunization: Secondary | ICD-10-CM | POA: Insufficient documentation

## 2013-09-05 DIAGNOSIS — N8501 Benign endometrial hyperplasia: Secondary | ICD-10-CM | POA: Insufficient documentation

## 2013-09-05 DIAGNOSIS — D649 Anemia, unspecified: Secondary | ICD-10-CM | POA: Insufficient documentation

## 2013-09-05 DIAGNOSIS — I10 Essential (primary) hypertension: Secondary | ICD-10-CM

## 2013-09-05 DIAGNOSIS — N920 Excessive and frequent menstruation with regular cycle: Secondary | ICD-10-CM | POA: Insufficient documentation

## 2013-09-05 DIAGNOSIS — Z9889 Other specified postprocedural states: Secondary | ICD-10-CM

## 2013-09-05 DIAGNOSIS — E119 Type 2 diabetes mellitus without complications: Secondary | ICD-10-CM | POA: Insufficient documentation

## 2013-09-05 HISTORY — PX: ROBOT ASSISTED MYOMECTOMY: SHX5142

## 2013-09-05 HISTORY — DX: Other specified postprocedural states: Z98.890

## 2013-09-05 HISTORY — PX: DILATATION & CURRETTAGE/HYSTEROSCOPY WITH RESECTOCOPE: SHX5572

## 2013-09-05 LAB — CBC
HCT: 27.8 % — ABNORMAL LOW (ref 36.0–46.0)
Hemoglobin: 8.5 g/dL — ABNORMAL LOW (ref 12.0–15.0)
MCHC: 30.6 g/dL (ref 30.0–36.0)
RBC: 4.32 MIL/uL (ref 3.87–5.11)

## 2013-09-05 LAB — BASIC METABOLIC PANEL
BUN: 10 mg/dL (ref 6–23)
Calcium: 8.5 mg/dL (ref 8.4–10.5)
GFR calc Af Amer: 64 mL/min — ABNORMAL LOW (ref >90)
GFR calc non Af Amer: 55 mL/min — ABNORMAL LOW (ref >90)
Potassium: 4.2 mEq/L (ref 3.5–5.1)
Sodium: 136 mEq/L (ref 135–145)

## 2013-09-05 LAB — PREGNANCY, URINE: Preg Test, Ur: NEGATIVE

## 2013-09-05 LAB — GLUCOSE, CAPILLARY
Glucose-Capillary: 172 mg/dL — ABNORMAL HIGH (ref 70–99)
Glucose-Capillary: 326 mg/dL — ABNORMAL HIGH (ref 70–99)
Glucose-Capillary: 269 mg/dL — ABNORMAL HIGH (ref 70–99)
Glucose-Capillary: 276 mg/dL — ABNORMAL HIGH (ref 70–99)

## 2013-09-05 LAB — PREPARE RBC (CROSSMATCH)

## 2013-09-05 SURGERY — ROBOTIC ASSISTED MYOMECTOMY
Anesthesia: General | Site: Vagina | Wound class: Clean Contaminated

## 2013-09-05 MED ORDER — GLYCOPYRROLATE 0.2 MG/ML IJ SOLN
INTRAMUSCULAR | Status: DC | PRN
  Administered 2013-09-05: .5 mg via INTRAVENOUS
  Administered 2013-09-05: .1 mg via INTRAVENOUS

## 2013-09-05 MED ORDER — METFORMIN HCL 500 MG PO TABS
1000.0000 mg | ORAL_TABLET | Freq: Two times a day (BID) | ORAL | Status: DC
  Administered 2013-09-06: 1000 mg via ORAL
  Filled 2013-09-05 (×4): qty 2

## 2013-09-05 MED ORDER — GLYCINE 1.5 % IR SOLN
Status: DC | PRN
  Administered 2013-09-05: 3000 mL

## 2013-09-05 MED ORDER — METOCLOPRAMIDE HCL 5 MG/ML IJ SOLN: 10.0000 mg | Freq: Once | INTRAMUSCULAR | Status: DC | PRN

## 2013-09-05 MED ORDER — BUPIVACAINE HCL (PF) 0.25 % IJ SOLN
INTRAMUSCULAR | Status: AC
  Filled 2013-09-05: qty 30

## 2013-09-05 MED ORDER — PHENYLEPHRINE HCL 10 MG/ML IJ SOLN
10.0000 mg | INTRAVENOUS | Status: DC | PRN
  Administered 2013-09-05: 30 ug/min via INTRAVENOUS

## 2013-09-05 MED ORDER — KETOROLAC TROMETHAMINE 30 MG/ML IJ SOLN
30.0000 mg | Freq: Four times a day (QID) | INTRAMUSCULAR | Status: DC
  Filled 2013-09-05: qty 1

## 2013-09-05 MED ORDER — CHLOROPROCAINE HCL 1 % IJ SOLN
INTRAMUSCULAR | Status: AC
  Filled 2013-09-05: qty 30

## 2013-09-05 MED ORDER — PNEUMOCOCCAL VAC POLYVALENT 25 MCG/0.5ML IJ INJ
0.5000 mL | INJECTION | INTRAMUSCULAR | Status: AC
  Administered 2013-09-06: 0.5 mL via INTRAMUSCULAR
  Filled 2013-09-05: qty 0.5

## 2013-09-05 MED ORDER — HYDROMORPHONE HCL PF 1 MG/ML IJ SOLN
0.2000 mg | INTRAMUSCULAR | Status: DC | PRN
  Administered 2013-09-05: 0.6 mg via INTRAVENOUS
  Filled 2013-09-05: qty 1

## 2013-09-05 MED ORDER — SUFENTANIL CITRATE 50 MCG/ML IV SOLN
INTRAVENOUS | Status: DC | PRN
  Administered 2013-09-05 (×7): 10 ug via INTRAVENOUS

## 2013-09-05 MED ORDER — IBUPROFEN 800 MG PO TABS
800.0000 mg | ORAL_TABLET | Freq: Three times a day (TID) | ORAL | Status: DC | PRN
  Administered 2013-09-06: 800 mg via ORAL
  Filled 2013-09-05: qty 1

## 2013-09-05 MED ORDER — INSULIN GLARGINE 100 UNIT/ML ~~LOC~~ SOLN
28.0000 [IU] | Freq: Every day | SUBCUTANEOUS | Status: DC
  Administered 2013-09-06: 28 [IU] via SUBCUTANEOUS
  Filled 2013-09-05 (×2): qty 0.28

## 2013-09-05 MED ORDER — INSULIN NPH (HUMAN) (ISOPHANE) 100 UNIT/ML ~~LOC~~ SUSP
4.0000 [IU] | Freq: Once | SUBCUTANEOUS | Status: DC
  Filled 2013-09-05: qty 10

## 2013-09-05 MED ORDER — BUPIVACAINE HCL (PF) 0.25 % IJ SOLN
INTRAMUSCULAR | Status: DC | PRN
  Administered 2013-09-05: 10 mL

## 2013-09-05 MED ORDER — LINAGLIPTIN 5 MG PO TABS
5.0000 mg | ORAL_TABLET | Freq: Two times a day (BID) | ORAL | Status: DC
  Administered 2013-09-06: 5 mg via ORAL
  Filled 2013-09-05 (×4): qty 1

## 2013-09-05 MED ORDER — MENTHOL 3 MG MT LOZG: 1.0000 | LOZENGE | OROMUCOSAL | Status: DC | PRN

## 2013-09-05 MED ORDER — PROPOFOL 10 MG/ML IV BOLUS
INTRAVENOUS | Status: DC | PRN
  Administered 2013-09-05: 150 mg via INTRAVENOUS

## 2013-09-05 MED ORDER — MIDAZOLAM HCL 2 MG/2ML IJ SOLN
INTRAMUSCULAR | Status: DC | PRN
  Administered 2013-09-05 (×2): 1 mg via INTRAVENOUS

## 2013-09-05 MED ORDER — OLMESARTAN MEDOXOMIL-HCTZ 40-12.5 MG PO TABS: 1.0000 | ORAL_TABLET | Freq: Every day | ORAL | Status: DC

## 2013-09-05 MED ORDER — KETOROLAC TROMETHAMINE 30 MG/ML IJ SOLN
INTRAMUSCULAR | Status: DC | PRN
  Administered 2013-09-05: 30 mg via INTRAVENOUS

## 2013-09-05 MED ORDER — SITAGLIPTIN PHOS-METFORMIN HCL 50-1000 MG PO TABS: 1.0000 | ORAL_TABLET | Freq: Two times a day (BID) | ORAL | Status: DC

## 2013-09-05 MED ORDER — ZOLPIDEM TARTRATE 5 MG PO TABS: 5.0000 mg | ORAL_TABLET | Freq: Every evening | ORAL | Status: DC | PRN

## 2013-09-05 MED ORDER — NEOSTIGMINE METHYLSULFATE 1 MG/ML IJ SOLN
INTRAMUSCULAR | Status: DC | PRN
  Administered 2013-09-05: 4 mg via INTRAVENOUS

## 2013-09-05 MED ORDER — INFLUENZA VAC SPLIT QUAD 0.5 ML IM SUSP
0.5000 mL | INTRAMUSCULAR | Status: AC
  Administered 2013-09-06: 0.5 mL via INTRAMUSCULAR
  Filled 2013-09-05: qty 0.5

## 2013-09-05 MED ORDER — ONDANSETRON HCL 4 MG/2ML IJ SOLN: 4.0000 mg | Freq: Four times a day (QID) | INTRAMUSCULAR | Status: DC | PRN

## 2013-09-05 MED ORDER — VASOPRESSIN 20 UNIT/ML IJ SOLN
INTRAMUSCULAR | Status: DC | PRN
  Administered 2013-09-05: 40 [IU]

## 2013-09-05 MED ORDER — INSULIN LISPRO 100 UNIT/ML (KWIKPEN)
6.0000 [IU] | PEN_INJECTOR | Freq: Three times a day (TID) | SUBCUTANEOUS | Status: DC
  Filled 2013-09-05: qty 3

## 2013-09-05 MED ORDER — VASOPRESSIN 20 UNIT/ML IJ SOLN
INTRAMUSCULAR | Status: AC
  Filled 2013-09-05: qty 2

## 2013-09-05 MED ORDER — DIPHENHYDRAMINE HCL 25 MG PO CAPS
25.0000 mg | ORAL_CAPSULE | Freq: Once | ORAL | Status: AC
  Administered 2013-09-05: 25 mg via ORAL
  Filled 2013-09-05: qty 1

## 2013-09-05 MED ORDER — ACETAMINOPHEN 325 MG PO TABS
650.0000 mg | ORAL_TABLET | Freq: Once | ORAL | Status: AC
  Administered 2013-09-05: 650 mg via ORAL
  Filled 2013-09-05: qty 2

## 2013-09-05 MED ORDER — PHENYLEPHRINE HCL 10 MG/ML IJ SOLN
INTRAMUSCULAR | Status: DC | PRN
  Administered 2013-09-05 (×2): .08 mg via INTRAVENOUS
  Administered 2013-09-05: .04 mg via INTRAVENOUS

## 2013-09-05 MED ORDER — SODIUM CHLORIDE 0.9 % IJ SOLN
INTRAMUSCULAR | Status: AC
  Filled 2013-09-05: qty 50

## 2013-09-05 MED ORDER — PANTOPRAZOLE SODIUM 40 MG PO TBEC
40.0000 mg | DELAYED_RELEASE_TABLET | Freq: Every day | ORAL | Status: DC
  Administered 2013-09-06: 40 mg via ORAL
  Filled 2013-09-05 (×3): qty 1

## 2013-09-05 MED ORDER — OXYCODONE-ACETAMINOPHEN 5-325 MG PO TABS
1.0000 | ORAL_TABLET | ORAL | Status: DC | PRN
  Administered 2013-09-05 – 2013-09-06 (×2): 2 via ORAL
  Filled 2013-09-05 (×2): qty 2

## 2013-09-05 MED ORDER — ACETAMINOPHEN 10 MG/ML IV SOLN
INTRAVENOUS | Status: DC | PRN
  Administered 2013-09-05: 1000 mg via INTRAVENOUS

## 2013-09-05 MED ORDER — ACETAMINOPHEN 10 MG/ML IV SOLN
1000.0000 mg | Freq: Once | INTRAVENOUS | Status: DC
  Filled 2013-09-05: qty 100

## 2013-09-05 MED ORDER — SODIUM CHLORIDE 0.9 % IJ SOLN
INTRAMUSCULAR | Status: AC
  Filled 2013-09-05: qty 10

## 2013-09-05 MED ORDER — INSULIN ASPART 100 UNIT/ML ~~LOC~~ SOLN
SUBCUTANEOUS | Status: AC
  Administered 2013-09-05: 4 [IU] via SUBCUTANEOUS
  Filled 2013-09-05: qty 1

## 2013-09-05 MED ORDER — SCOPOLAMINE 1 MG/3DAYS TD PT72
1.0000 | MEDICATED_PATCH | TRANSDERMAL | Status: DC
  Administered 2013-09-05: 1.5 mg via TRANSDERMAL
  Administered 2013-09-05: 1 via TRANSDERMAL

## 2013-09-05 MED ORDER — IRBESARTAN 300 MG PO TABS
300.0000 mg | ORAL_TABLET | Freq: Every day | ORAL | Status: DC
  Administered 2013-09-06: 300 mg via ORAL
  Filled 2013-09-05 (×2): qty 1

## 2013-09-05 MED ORDER — KETOROLAC TROMETHAMINE 30 MG/ML IJ SOLN
30.0000 mg | Freq: Four times a day (QID) | INTRAMUSCULAR | Status: DC
  Administered 2013-09-05 – 2013-09-06 (×3): 30 mg via INTRAVENOUS
  Filled 2013-09-05 (×3): qty 1

## 2013-09-05 MED ORDER — FENTANYL CITRATE 0.05 MG/ML IJ SOLN: 25.0000 ug | INTRAMUSCULAR | Status: DC | PRN

## 2013-09-05 MED ORDER — ONDANSETRON HCL 4 MG PO TABS: 4.0000 mg | ORAL_TABLET | Freq: Four times a day (QID) | ORAL | Status: DC | PRN

## 2013-09-05 MED ORDER — ONDANSETRON HCL 4 MG/2ML IJ SOLN
INTRAMUSCULAR | Status: DC | PRN
  Administered 2013-09-05 (×2): 2 mg via INTRAVENOUS

## 2013-09-05 MED ORDER — ROCURONIUM BROMIDE 100 MG/10ML IV SOLN
INTRAVENOUS | Status: DC | PRN
  Administered 2013-09-05 (×2): 10 mg via INTRAVENOUS
  Administered 2013-09-05: 50 mg via INTRAVENOUS
  Administered 2013-09-05: 10 mg via INTRAVENOUS

## 2013-09-05 MED ORDER — HYDROCHLOROTHIAZIDE 12.5 MG PO CAPS
12.5000 mg | ORAL_CAPSULE | Freq: Every day | ORAL | Status: DC
  Administered 2013-09-06: 12.5 mg via ORAL
  Filled 2013-09-05 (×2): qty 1

## 2013-09-05 MED ORDER — HYDROMORPHONE HCL PF 1 MG/ML IJ SOLN
INTRAMUSCULAR | Status: DC | PRN
  Administered 2013-09-05 (×2): 1 mg via INTRAVENOUS

## 2013-09-05 MED ORDER — LIDOCAINE HCL (CARDIAC) 20 MG/ML IV SOLN
INTRAVENOUS | Status: DC | PRN
  Administered 2013-09-05: 60 mg via INTRAVENOUS

## 2013-09-05 MED ORDER — LACTATED RINGERS IV SOLN
INTRAVENOUS | Status: DC
  Administered 2013-09-05 (×2): via INTRAVENOUS

## 2013-09-05 MED ORDER — MEPERIDINE HCL 25 MG/ML IJ SOLN: 6.2500 mg | INTRAMUSCULAR | Status: DC | PRN

## 2013-09-05 MED ORDER — SCOPOLAMINE 1 MG/3DAYS TD PT72
MEDICATED_PATCH | TRANSDERMAL | Status: AC
  Filled 2013-09-05: qty 1

## 2013-09-05 MED ORDER — INSULIN ASPART 100 UNIT/ML ~~LOC~~ SOLN
6.0000 [IU] | Freq: Three times a day (TID) | SUBCUTANEOUS | Status: DC
  Administered 2013-09-05 – 2013-09-06 (×3): 6 [IU] via SUBCUTANEOUS

## 2013-09-05 MED ORDER — LACTATED RINGERS IR SOLN
Status: DC | PRN
  Administered 2013-09-05: 3000 mL

## 2013-09-05 MED ORDER — LACTATED RINGERS IV SOLN
INTRAVENOUS | Status: DC
  Administered 2013-09-05: 18:00:00 via INTRAVENOUS

## 2013-09-05 SURGICAL SUPPLY — 71 items
ADH SKN CLS APL DERMABOND .7 (GAUZE/BANDAGES/DRESSINGS) ×2
BARRIER ADHS 3X4 INTERCEED (GAUZE/BANDAGES/DRESSINGS) ×3 IMPLANT
BLADE LAP MORCELLATOR 15X9.5 (ELECTROSURGICAL) ×1 IMPLANT
BRR ADH 4X3 ABS CNTRL BYND (GAUZE/BANDAGES/DRESSINGS) ×2
CANISTER SUCT 3000ML (MISCELLANEOUS) ×3 IMPLANT
CANNULA SEAL DVNC (CANNULA) ×6 IMPLANT
CANNULA SEALS DA VINCI (CANNULA) ×3
CATH ROBINSON RED A/P 16FR (CATHETERS) ×3 IMPLANT
CHLORAPREP W/TINT 26ML (MISCELLANEOUS) ×3 IMPLANT
CLOTH BEACON ORANGE TIMEOUT ST (SAFETY) ×3 IMPLANT
CONT PATH 16OZ SNAP LID 3702 (MISCELLANEOUS) ×3 IMPLANT
CONTAINER PREFILL 10% NBF 60ML (FORM) ×6 IMPLANT
COVER MAYO STAND STRL (DRAPES) ×3 IMPLANT
COVER TABLE BACK 60X90 (DRAPES) ×6 IMPLANT
COVER TIP SHEARS 8 DVNC (MISCELLANEOUS) ×2 IMPLANT
COVER TIP SHEARS 8MM DA VINCI (MISCELLANEOUS) ×1
DECANTER SPIKE VIAL GLASS SM (MISCELLANEOUS) ×3 IMPLANT
DERMABOND ADVANCED (GAUZE/BANDAGES/DRESSINGS) ×1
DERMABOND ADVANCED .7 DNX12 (GAUZE/BANDAGES/DRESSINGS) ×2 IMPLANT
DRAPE HUG U DISPOSABLE (DRAPE) ×3 IMPLANT
DRAPE LG THREE QUARTER DISP (DRAPES) ×6 IMPLANT
DRAPE WARM FLUID 44X44 (DRAPE) ×3 IMPLANT
DRESSING TELFA 8X3 (GAUZE/BANDAGES/DRESSINGS) ×3 IMPLANT
ELECT REM PT RETURN 9FT ADLT (ELECTROSURGICAL) ×3
ELECTRODE REM PT RTRN 9FT ADLT (ELECTROSURGICAL) ×2 IMPLANT
ELECTRODE ROLLER VERSAPOINT (ELECTRODE) IMPLANT
ELECTRODE RT ANGLE VERSAPOINT (CUTTING LOOP) IMPLANT
EVACUATOR SMOKE 8.L (FILTER) ×3 IMPLANT
GAUZE VASELINE 3X9 (GAUZE/BANDAGES/DRESSINGS) IMPLANT
GLOVE BIOGEL PI IND STRL 7.0 (GLOVE) ×4 IMPLANT
GLOVE BIOGEL PI INDICATOR 7.0 (GLOVE) ×2
GLOVE ECLIPSE 7.0 STRL STRAW (GLOVE) ×3 IMPLANT
GOWN STRL REIN XL XLG (GOWN DISPOSABLE) ×18 IMPLANT
GOWN SURGICAL LARGE (GOWNS) ×2 IMPLANT
KIT ACCESSORY DA VINCI DISP (KITS) ×1
KIT ACCESSORY DVNC DISP (KITS) ×2 IMPLANT
LEGGING LITHOTOMY PAIR STRL (DRAPES) ×3 IMPLANT
LOOP ANGLED CUTTING 22FR (CUTTING LOOP) IMPLANT
NEEDLE INSUFFLATION 120MM (ENDOMECHANICALS) ×3 IMPLANT
NS IRRIG 1000ML POUR BTL (IV SOLUTION) ×9 IMPLANT
OCCLUDER COLPOPNEUMO (BALLOONS) ×1 IMPLANT
PACK HYSTEROSCOPY LF (CUSTOM PROCEDURE TRAY) ×3 IMPLANT
PACK LAVH (CUSTOM PROCEDURE TRAY) ×3 IMPLANT
PAD OB MATERNITY 4.3X12.25 (PERSONAL CARE ITEMS) ×3 IMPLANT
SET CYSTO W/LG BORE CLAMP LF (SET/KITS/TRAYS/PACK) ×1 IMPLANT
SET IRRIG TUBING LAPAROSCOPIC (IRRIGATION / IRRIGATOR) ×3 IMPLANT
SOLUTION ELECTROLUBE (MISCELLANEOUS) ×3 IMPLANT
SUT VIC AB 0 CT1 27 (SUTURE) ×15
SUT VIC AB 0 CT1 27XBRD ANBCTR (SUTURE) ×10 IMPLANT
SUT VIC AB 2-0 SH 27 (SUTURE) ×3
SUT VIC AB 2-0 SH 27XBRD (SUTURE) IMPLANT
SUT VICRYL 0 UR6 27IN ABS (SUTURE) ×3 IMPLANT
SUT VICRYL RAPIDE 4/0 PS 2 (SUTURE) ×8 IMPLANT
SUT VLOC 180 0 9IN  GS21 (SUTURE) ×2
SUT VLOC 180 0 9IN GS21 (SUTURE) IMPLANT
SYR 50ML LL SCALE MARK (SYRINGE) ×3 IMPLANT
SYSTEM CONVERTIBLE TROCAR (TROCAR) IMPLANT
TIP UTERINE 5.1X6CM LAV DISP (MISCELLANEOUS) IMPLANT
TIP UTERINE 6.7X10CM GRN DISP (MISCELLANEOUS) ×1 IMPLANT
TIP UTERINE 6.7X6CM WHT DISP (MISCELLANEOUS) IMPLANT
TIP UTERINE 6.7X8CM BLUE DISP (MISCELLANEOUS) IMPLANT
TOWEL OR 17X24 6PK STRL BLUE (TOWEL DISPOSABLE) ×9 IMPLANT
TRAY FOLEY BAG SILVER LF 14FR (CATHETERS) ×3 IMPLANT
TROCAR 12M 150ML BLUNT (TROCAR) ×2 IMPLANT
TROCAR DISP BLADELESS 8 DVNC (TROCAR) ×2 IMPLANT
TROCAR DISP BLADELESS 8MM (TROCAR) ×1
TROCAR XCEL 12X100 BLDLESS (ENDOMECHANICALS) IMPLANT
TROCAR Z-THREAD 12X150 (TROCAR) ×3 IMPLANT
TUBING FILTER THERMOFLATOR (ELECTROSURGICAL) ×3 IMPLANT
WARMER LAPAROSCOPE (MISCELLANEOUS) ×3 IMPLANT
WATER STERILE IRR 1000ML POUR (IV SOLUTION) ×3 IMPLANT

## 2013-09-05 NOTE — Anesthesia Postprocedure Evaluation (Signed)
  Anesthesia Post Note  Patient: Dana Fowler  Procedure(s) Performed: Procedure(s) (LRB): ROBOTIC ASSISTED MYOMECTOMY with morcellation (N/A) DILATATION & CURETTAGE/HYSTEROSCOPY WITH RESECTOCOPE (N/A)  Anesthesia type: GA  Patient location: PACU  Post pain: Pain level controlled  Post assessment: Post-op Vital signs reviewed  Last Vitals:  Filed Vitals:   09/05/13 1315  BP: 92/54  Pulse: 92  Temp:   Resp: 14    Post vital signs: Reviewed  Level of consciousness: sedated  Complications: No apparent anesthesia complications

## 2013-09-05 NOTE — Anesthesia Procedure Notes (Signed)
Procedure Name: Intubation Date/Time: 09/05/2013 7:40 AM Performed by: Amirra Herling ADEDAYO Koa Palla Pre-anesthesia Checklist: Patient identified, Emergency Drugs available, Suction available, Patient being monitored and Timeout performed Patient Re-evaluated:Patient Re-evaluated prior to inductionOxygen Delivery Method: Circle system utilized Intubation Type: IV induction Ventilation: Mask ventilation without difficulty Laryngoscope Size: Miller and 2 Grade View: Grade II Tube type: Oral Tube size: 7.0 mm Number of attempts: 1 Placement Confirmation: ETT inserted through vocal cords under direct vision,  positive ETCO2 and breath sounds checked- equal and bilateral Secured at: 20 cm Tube secured with: Tape Dental Injury: Teeth and Oropharynx as per pre-operative assessment

## 2013-09-05 NOTE — Progress Notes (Signed)
Spoke w/ pt: reviewed intraop findings Note low hgb and tachycardia. Disc blood transfusion and its risk. Recommend transfusion given. Pt agree. Had already signed for it prior to surgery. Will proceed  Elevated BS. Pt advised to resume her insulin and will have her start eating

## 2013-09-05 NOTE — Brief Op Note (Signed)
09/05/2013  1:20 PM  PATIENT:  Dana Fowler  47 y.o. female  PRE-OPERATIVE DIAGNOSIS: Exophytic Fibroid, Menorrhagia, Endometrial Mass, severe anemia, diabetes  POST-OPERATIVE DIAGNOSIS: large post subserosal  fibroid,menorrhagia,endometrial mass, severe anemia, diabetes  PROCEDURE:  Dx hysteroscopy, Dilation and curettage, davinci robotic myomectomy  SURGEON:  Surgeon(s) and Role:    * Adarrius Graeff A Lane Kjos, MD - Primary  PHYSICIAN ASSISTANT:   ASSISTANTS: Marlinda Mike, CNM Raelyn Mora, CNM   ANESTHESIA:   general FINDINGS; 7-8 cm post LUS subserosal fibroid, nl tubes and ovaries, thickened irregular endometrial cavity,  Left ant fibroid medial to round ligament, friable uterine surface EBL:  Total I/O In: 1000 [I.V.:1000] Out: 700 [Urine:500; Blood:200]  BLOOD ADMINISTERED:none  DRAINS: none   LOCAL MEDICATIONS USED:  MARCAINE     SPECIMEN:  Source of Specimen:  myoma, emc with poss polyps  DISPOSITION OF SPECIMEN:  PATHOLOGY  COUNTS:  YES  TOURNIQUET:  * No tourniquets in log *  DICTATION: .Other Dictation: Dictation Number [id number]  PLAN OF CARE: Admit for overnight observation  PATIENT DISPOSITION:  PACU - hemodynamically stable.   Delay start of Pharmacological VTE agent (>24hrs) due to surgical blood loss or risk of bleeding: no

## 2013-09-05 NOTE — Transfer of Care (Signed)
Immediate Anesthesia Transfer of Care Note  Patient: Dana Fowler  Procedure(s) Performed: Procedure(s) with comments: ROBOTIC ASSISTED MYOMECTOMY with morcellation (N/A) - 3 hrs. DILATATION & CURETTAGE/HYSTEROSCOPY WITH RESECTOCOPE (N/A)  Patient Location: PACU  Anesthesia Type:General  Level of Consciousness: awake, alert  and oriented  Airway & Oxygen Therapy: Patient Spontanous Breathing and Patient connected to nasal cannula oxygen  Post-op Assessment: Report given to PACU RN, Post -op Vital signs reviewed and stable and Patient moving all extremities X 4  Post vital signs: Reviewed and stable  Complications: No apparent anesthesia complications

## 2013-09-05 NOTE — Progress Notes (Signed)
Pt refused sacral dressing. 

## 2013-09-06 ENCOUNTER — Encounter (HOSPITAL_COMMUNITY): Payer: Self-pay | Admitting: Obstetrics and Gynecology

## 2013-09-06 DIAGNOSIS — Z9889 Other specified postprocedural states: Secondary | ICD-10-CM

## 2013-09-06 HISTORY — DX: Other specified postprocedural states: Z98.890

## 2013-09-06 LAB — HEMOGLOBIN A1C: Mean Plasma Glucose: 200 mg/dL — ABNORMAL HIGH (ref <117)

## 2013-09-06 LAB — CBC
HCT: 21.8 % — ABNORMAL LOW (ref 36.0–46.0)
Hemoglobin: 10.9 g/dL — ABNORMAL LOW (ref 12.0–15.0)
MCH: 19.8 pg — ABNORMAL LOW (ref 26.0–34.0)
MCH: 23 pg — ABNORMAL LOW (ref 26.0–34.0)
MCHC: 30.7 g/dL (ref 30.0–36.0)
MCHC: 32.7 g/dL (ref 30.0–36.0)
MCV: 70.4 fL — ABNORMAL LOW (ref 78.0–100.0)
Platelets: 287 10*3/uL (ref 150–400)
RBC: 3.39 MIL/uL — ABNORMAL LOW (ref 3.87–5.11)
RDW: 18.1 % — ABNORMAL HIGH (ref 11.5–15.5)
RDW: 21.2 % — ABNORMAL HIGH (ref 11.5–15.5)
WBC: 8.8 10*3/uL (ref 4.0–10.5)

## 2013-09-06 LAB — GLUCOSE, CAPILLARY: Glucose-Capillary: 189 mg/dL — ABNORMAL HIGH (ref 70–99)

## 2013-09-06 MED ORDER — POLYSACCHARIDE IRON COMPLEX 150 MG PO CAPS
150.0000 mg | ORAL_CAPSULE | Freq: Every day | ORAL | Status: DC
  Administered 2013-09-06: 150 mg via ORAL
  Filled 2013-09-06 (×2): qty 1

## 2013-09-06 MED ORDER — LINAGLIPTIN 5 MG PO TABS: 5.0000 mg | ORAL_TABLET | Freq: Two times a day (BID) | ORAL | Status: DC

## 2013-09-06 MED ORDER — METFORMIN HCL 1000 MG PO TABS: 1000.0000 mg | ORAL_TABLET | Freq: Two times a day (BID) | ORAL | Status: DC

## 2013-09-06 MED ORDER — IBUPROFEN 800 MG PO TABS: 800.0000 mg | ORAL_TABLET | Freq: Three times a day (TID) | ORAL | Status: DC | PRN

## 2013-09-06 MED ORDER — SODIUM CHLORIDE 0.9 % IV SOLN
1020.0000 mg | Freq: Once | INTRAVENOUS | Status: AC
  Administered 2013-09-06: 1020 mg via INTRAVENOUS
  Filled 2013-09-06: qty 34

## 2013-09-06 MED ORDER — POLYSACCHARIDE IRON COMPLEX 150 MG PO CAPS: 150.0000 mg | ORAL_CAPSULE | Freq: Two times a day (BID) | ORAL | Status: DC

## 2013-09-06 MED ORDER — OXYCODONE-ACETAMINOPHEN 5-325 MG PO TABS: 1.0000 | ORAL_TABLET | ORAL | Status: DC | PRN

## 2013-09-06 NOTE — Progress Notes (Signed)
Dr. Cherly Hensen called and asked if it would possible if lab would be able to run patient's A1c off of blood from yesterdays CBC results. Called lab and spoke with Myriam Jacobson, who stated that would be possible. Dr. Cherly Hensen called back and notified of lab's answer and gave orders to d/c the A1c scheduled for 0900 today.-------

## 2013-09-06 NOTE — Anesthesia Postprocedure Evaluation (Signed)
  Anesthesia Post-op Note  Patient: Dana Fowler  Procedure(s) Performed: Procedure(s) with comments: ROBOTIC ASSISTED MYOMECTOMY with morcellation (N/A) - 3 hrs. DILATATION & CURETTAGE/HYSTEROSCOPY WITH RESECTOCOPE (N/A)  Patient Location: [facility]  Anesthesia Type:General  Level of Consciousness: awake  Airway and Oxygen Therapy: Patient Spontanous Breathing  Post-op Pain: none  Post-op Assessment: Patient's Cardiovascular Status Stable, Respiratory Function Stable, Patent Airway, No signs of Nausea or vomiting, Adequate PO intake and Pain level controlled  Post-op Vital Signs: Reviewed and stable  Complications: No apparent anesthesia complications

## 2013-09-06 NOTE — Progress Notes (Signed)
Subjective: Patient reports tolerating PO and no problems voiding.    Objective: I have reviewed patient's vital signs.  vital signs, intake and output and labs. Filed Vitals:   09/06/13 0514  BP: 110/77  Pulse: 106  Temp: 98.6 F (37 C)  Resp: 16   I/O last 3 completed shifts: In: 3814.8 [I.V.:2651.8; Blood:1163] Out: 1075 [Urine:875; Blood:200]    Lab Results  Component Value Date   WBC 11.6* 09/05/2013   HGB 6.7* 09/05/2013   HCT 21.8* 09/05/2013   MCV 64.3* 09/05/2013   PLT 349 09/05/2013   Lab Results  Component Value Date   CREATININE 1.16* 09/05/2013   CBC    Component Value Date/Time   WBC 8.8 09/06/2013 0915   RBC 4.73 09/06/2013 0915   HGB 10.9* 09/06/2013 0915   HCT 33.3* 09/06/2013 0915   PLT 287 09/06/2013 0915   MCV 70.4* 09/06/2013 0915   MCH 23.0* 09/06/2013 0915   MCHC 32.7 09/06/2013 0915   RDW 21.2* 09/06/2013 0915   LYMPHSABS 2.1 04/20/2013 1610   MONOABS 0.5 04/20/2013 1610   EOSABS 0.2 04/20/2013 1610   BASOSABS 0.0 04/20/2013 1610     EXAM General: alert, cooperative and no distress Resp: clear to auscultation bilaterally Cardio: regular rate and rhythm, S1, S2 normal, no murmur, click, rub or gallop GI: soft, non-tender; bowel sounds normal; no masses,  no organomegaly and incision: clean, dry and intact Extremities: no edema, redness or tenderness in the calves or thighs Vaginal Bleeding: minimal  Assessment: s/p Procedure(s):DaVinci ROBOTIC ASSISTED MYOMECTOMY with morcellation, dx hysteroscopy, dilation and curettage: stable, tolerating diet and anemia Iron deficiency anemia S/P 4 Units of PRBC. IV feraheme SS trait Hgb A1c pending. Plan: Advance diet Encourage ambulation Discontinue IV fluids Discharge home diabetic teaching Stressed need for better  Diabetes control. Endocrine referral for mgmt. Reviewed d/c instruction F/u 2 weeks   LOS: 1 day    Pruitt Taboada A, MD 09/06/2013 9:13 AM    09/06/2013, 9:13 AM

## 2013-09-06 NOTE — Progress Notes (Signed)
  RD consulted for nutrition education regarding diabetes.   Lab Results  Component Value Date   HGBA1C 9.7* 06/06/2013    RD provided "Carbohydrate Counting for People with Diabetes" handout from the Academy of Nutrition and Dietetics. Discussed different food groups and their effects on blood sugar, emphasizing carbohydrate-containing foods. Provided list of carbohydrates and recommended serving sizes of common foods.  Discussed importance of controlled and consistent carbohydrate intake throughout the day, 4 servings of CHO or 60 g/meal. Provided examples of ways to balance meals/snacks and encouraged intake of high-fiber, whole grain complex carbohydrates. Teach back method used.  Expect adequate compliance. Further reinforcement through out-pt DM education is recommended. Pt verbalized difficulty in maintaining schedule for and consistency of meals.      Elisabeth Cara M.Odis Luster LDN Neonatal Nutrition Support Specialist Pager [phone]

## 2013-09-06 NOTE — Discharge Summary (Signed)
Physician Discharge Summary  Patient ID: Dana Fowler MRN: [id number] DOB/AGE: 1966-03-05 47 y.o.  Admit date: 09/05/2013 Discharge date: 09/06/2013  Admission Diagnoses:  Fibroid uterus, menorrhagia, anemia, endometrial mass, Type 2 DM  Discharge Diagnoses: SS uterine fibroid, menorrhagia, SS trait, iron deficiency anemia, endometrial mass, Type 2 DM  Principal Problem:   S/P Robotic assisted Myomectomy (10/21) dx hysteroscopy, D&C  Discharged Condition: stable  Hospital Course: Pt was admitted to [facility]. She had evidence of uncontrolled DM based on BS. Pt was also severely anemic. Pt underwent dx hysteroscopy, D&C, daVinci robotic myomectomy. Postoperatively, pt was given 4 units of PRBC and IV feraheme. Pt has underlying SS trait. Pt also had hgb A1C done. She was seen by diabetic teaching with extensive counselling  Consults: diabetic teaching  Significant Diagnostic Studies: labs:  CBC    Component Value Date/Time   WBC 8.8 09/06/2013 0915   RBC 4.73 09/06/2013 0915   HGB 10.9* 09/06/2013 0915   HCT 33.3* 09/06/2013 0915   PLT 287 09/06/2013 0915   MCV 70.4* 09/06/2013 0915   MCH 23.0* 09/06/2013 0915   MCHC 32.7 09/06/2013 0915   RDW 21.2* 09/06/2013 0915   LYMPHSABS 2.1 04/20/2013 1610   MONOABS 0.5 04/20/2013 1610   EOSABS 0.2 04/20/2013 1610   BASOSABS 0.0 04/20/2013 1610      Treatments: surgery: DaVinci robotic myomectomy, dx hysteroscopy, dilation and curettage: blood transfusion, IV iron infusion  Discharge Exam: Blood pressure 110/77, pulse 106, temperature 98.6 F (37 C), temperature source Oral, resp. rate 16, height 5\' 4"  (1.626 m), weight 76.204 kg (168 lb), SpO2 97.00%. General appearance: alert, cooperative, appears stated age and no distress Resp: clear to auscultation bilaterally Cardio: regular rate and rhythm, S1, S2 normal, no murmur, click, rub or gallop GI: soft, non-tender; bowel sounds normal; no masses,  no organomegaly Pelvic:  deferred Incision/Wound:incision well approximated: dry/clean/intact  Disposition: 01-Home or Self Care  Discharge Orders   Future Orders Complete By Expires   Discharge patient  As directed    Discharge patient  As directed        Medication List         dextromethorphan 30 MG/5ML liquid  Commonly known as:  DELSYM  Take 60 mg by mouth as needed for cough.     glucose blood test strip  Commonly known as:  ONE TOUCH ULTRA TEST  Use as instructed to check blood sugars 4 times per day     ibuprofen 800 MG tablet  Commonly known as:  ADVIL,MOTRIN  Take 1 tablet (800 mg total) by mouth every 8 (eight) hours as needed (mild pain).     insulin glargine 100 UNIT/ML injection  Commonly known as:  LANTUS  Inject 28 Units into the skin daily at 12 noon. Depends on blood glucose when checking at night     insulin lispro 100 UNIT/ML Sopn  Commonly known as:  HUMALOG KWIKPEN  Inject 10 units in the morning and 12 units in the evening.  Total of 22 units per day     iron polysaccharides 150 MG capsule  Commonly known as:  NU-IRON  Take 1 capsule (150 mg total) by mouth 2 (two) times daily.     linagliptin 5 MG Tabs tablet  Commonly known as:  TRADJENTA  Take 1 tablet (5 mg total) by mouth 2 (two) times daily with a meal.     LUBRICANT EYE DROPS 0.5 % Soln  Generic drug:  carboxymethylcellulose  1 drop 2 (two)  times daily as needed.     metFORMIN 1000 MG tablet  Commonly known as:  GLUCOPHAGE  Take 1 tablet (1,000 mg total) by mouth 2 (two) times daily with a meal.     olmesartan-hydrochlorothiazide 40-12.5 MG per tablet  Commonly known as:  BENICAR HCT  Take 1 tablet by mouth daily.     oxyCODONE-acetaminophen 5-325 MG per tablet  Commonly known as:  PERCOCET/ROXICET  Take 1-2 tablets by mouth every 4 (four) hours as needed.     pravastatin 40 MG tablet  Commonly known as:  PRAVACHOL  Take 1 tablet (40 mg total) by mouth daily.     sitaGLIPtin-metformin 50-1000 MG per  tablet  Commonly known as:  JANUMET  Take 1 tablet by mouth 2 (two) times daily with a meal.     vitamin C 1000 MG tablet  Take 1,000 mg by mouth daily.           Follow-up Information   Follow up with Ieshia Hatcher A, MD In 2 weeks.   Specialty:  Obstetrics and Gynecology   Contact information:   [address] [place] [address] [phone]       Signed: Maxie Better A 09/06/2013, 9:24 AM

## 2013-09-06 NOTE — Op Note (Addendum)
NAME:  Dana Fowler, THEW NO.:  [id number]  MEDICAL RECORD NO.:  [id number]  LOCATION:  9318                          FACILITY:  [facility]  PHYSICIAN:  Maxie Better, M.D.DATE OF BIRTH:  February 28, 1966  DATE OF PROCEDURE:  09/05/2013 DATE OF DISCHARGE:                              OPERATIVE REPORT   PREOPERATIVE DIAGNOSIS:  Menorrhagia, iron deficiency anemia, fibroid uterus, endometrial thickening.  POSTOPERATIVE DIAGNOSIS:  Menorrhagia, anemia, fibroid uterus, endometrial thickening.  PROCEDURE:  Diagnostic hysteroscopy, dilation and curettage, da Vinci robotic myomectomy.  ANESTHESIA:  General.  SURGEON:  Maxie Better, M.D.  ASSISTANT:  Raelyn Mora, C.N.M. and Marlinda Mike, C.N.M.  DESCRIPTION OF PROCEDURE:  Under adequate general anesthesia, the patient was positioned for robotic surgery.  She was placed in the dorsal lithotomy position.  Examination under anesthesia revealed a retroverted pelvic mass consistent with a known fibroid, and the uterus that was displaced anteriorly measuring about 14-week size.  The patient was then sterilely prepped and draped in the usual fashion.  An indwelling Foley catheter was sterilely placed.  The procedure was started with a hysteroscope being done first.  A bivalve speculum was placed in the vagina.  The anterior lip of the cervix was grasped with a single-tooth tenaculum, and the cervix was then serially dilated up to #23 [facility] dilator.  A diagnostic hysteroscope was introduced into the uterine cavity, and there was some overall thickening of the endometrium, and there was a fullness on the left side of the uterus internally.  The hysteroscope was removed.  The cavity was further dilated, and moderate amount of tissue was curettaged from the endometrial cavity.  Once this was done, the uterus sounded to 9 cm, but a uterine manipulator #10 was introduced into the uterine cavity and the balloon insufflated to  manipulate uterus.  Attention was then turned to the abdomen.  A supraumbilical incision made.  Veress needle was introduced, hardly any flow to none was seen.  Using the Visiport under direct visualization, the abdomen was entered.  The patient was placed in deep Trendelenburg position.  The pelvis was inspected with the uterus noted to be normal with a small left cornual anterior fibroid.  The robotic ports sites were then placed with  2 on the left at 8 mm and 1 on the right, 8 mm and a 12 mm assistant port.  The robot was then docked.  The pelvis was initially inspected with a probe, and it was noted that the fibroid was described as exophytic on MRI, was in fact a lower uterine segment posterior subserosal fibroid.  Normal tubes and ovaries was noted.  The posterior aspect of the overlying serosal surface of the fibroid was injected with dilute solution of Pitressin.  This was done circumferentially around the surface of the posterior fibroid.  Once the robot was docked, the permanent hook was placed in arm #1.  The robotic tenaculum was placed in arm #3, and the PK dissector was placed in arm #2.  I then went to the console.  The pelvis was further inspected.  The large posterior  fibroid was of note.  A semi transverse incision was made overlying the fibroid  and with countertraction and traction using the tenaculum, the fibroid, which was much degenerated was noted to be yellow and seeding.  The mass was enucleated.  The defect was closed with 0 V-Loc  And 2-0 V Loc sutures.  The other smaller fibroid was not removed as it took a tremendous effort to close the main incision due to tearing of the tissue. The abdomen was copiously irrigated and decision was then made as to how to remove the fibroid. The robot had been undocked.  At that point, an Endobag was placed in the right lower Quadrant port site, and the specimen was placed in the bag.  Once this was done, the hot scissors were  then used to chop the mass into small manageable pieces.  The specimen bag was brought out through the right lower quadrant field.  The bag opened, and the tissue was removed that was in the bag.  No spillage was noted. Once the hemostasis was achieved, the abdomen was then copiously irrigated and suctioned.  Surgicel was placed posteriorly as well as the Interceed.  Once this was done, the port sites were removed under direct visualization.  The skin incision that had been extended in the right lower quadrant in order to help facilitate removal of that mass was closed with 0 Vicryl stitches for the fascia.  All the skin incisions were approximated using 4-0 Vicryl sutures.  SPECIMEN:  The endometrial curetting with polyps, and uterine myoma, all sent to Pathology.  ESTIMATED BLOOD LOSS:  250 mL.  COMPLICATION:  None.  The patient tolerated the procedure well, was transferred to recovery in stable condition.     Maxie Better, M.D.     [place]/MEDQ  D:  09/05/2013  T:  09/06/2013  Job:  [id number]

## 2013-09-06 NOTE — Progress Notes (Signed)
Patient education was complete. Patient was given extra literature on how to care for her diabetes. The dietician came and spoke to patient about carb counting and different types of foods to choose for her meals. Patient was instructed to call Dr. Cherly Hensen to make a follow up appointment in two weeks. Patient was educated on all medications, including home medications and newly prescribed medications, to take once discharged from hospital. Patient did not have any further questions and stated she understood all instructions. Patient was pushed downstairs in a wheelchair by tech and discharged home with family.

## 2013-09-06 NOTE — Progress Notes (Signed)
Dr. Cherly Hensen requested dietician to come talk to the patient. Dietician was called and stated she would come talk to the patient this morning before discharge.

## 2013-09-07 LAB — TYPE AND SCREEN
ABO/RH(D): O POS
Antibody Screen: NEGATIVE
Unit division: 0
Unit division: 0
Unit division: 0

## 2013-09-21 ENCOUNTER — Other Ambulatory Visit: Payer: 59

## 2013-09-25 ENCOUNTER — Encounter: Payer: Self-pay | Admitting: Endocrinology

## 2013-09-25 ENCOUNTER — Other Ambulatory Visit (INDEPENDENT_AMBULATORY_CARE_PROVIDER_SITE_OTHER): Payer: 59

## 2013-09-25 ENCOUNTER — Ambulatory Visit (INDEPENDENT_AMBULATORY_CARE_PROVIDER_SITE_OTHER): Payer: 59 | Admitting: Endocrinology

## 2013-09-25 VITALS — BP 130/86 | HR 89 | Temp 98.4°F | Resp 12 | Ht 64.0 in | Wt 172.2 lb

## 2013-09-25 DIAGNOSIS — IMO0001 Reserved for inherently not codable concepts without codable children: Secondary | ICD-10-CM

## 2013-09-25 DIAGNOSIS — E785 Hyperlipidemia, unspecified: Secondary | ICD-10-CM

## 2013-09-25 DIAGNOSIS — I10 Essential (primary) hypertension: Secondary | ICD-10-CM

## 2013-09-25 DIAGNOSIS — E876 Hypokalemia: Secondary | ICD-10-CM

## 2013-09-25 LAB — LIPID PANEL
HDL: 50.5 mg/dL (ref >39.00)
Triglycerides: 275 mg/dL — ABNORMAL HIGH (ref 0.0–149.0)

## 2013-09-25 LAB — COMPREHENSIVE METABOLIC PANEL
AST: 22 U/L (ref 0–37)
Alkaline Phosphatase: 58 U/L (ref 39–117)
BUN: 18 mg/dL (ref 6–23)
CO2: 23 mEq/L (ref 19–32)
Calcium: 9.2 mg/dL (ref 8.4–10.5)
Chloride: 101 mEq/L (ref 96–112)
Creatinine, Ser: 1.1 mg/dL (ref 0.4–1.2)
GFR: 68.21 mL/min (ref >60.00)

## 2013-09-25 NOTE — Patient Instructions (Signed)
HUMALOG 5-10 MIN BEFORE EACH MEAL  Mark readings after meals on meter

## 2013-09-25 NOTE — Progress Notes (Signed)
Patient ID: Dana Fowler, female   DOB: September 12, 1966, 47 y.o.   MRN: [id number]   Reason for Appointment : Followup for Type 2 Diabetes  History of Present Illness          Diagnosis: Type 2 diabetes mellitus, date of diagnosis: 15 years ago with blood sugar 500 and symptoms of blurred vision   PAST history: She has been treated with insulin for more than one year although detailed records are not available. Previously has been on various drugs including Janumet. She was also given Amaryl about a year ago without much benefit Her blood sugar control had been  persistently poor as judged by her A1c which has been consistently over 11% since 2011 Prior to her visit here she was taking variable doses of Lantus at bedtime based on her bedtime reading  RECENT history: She has been started on Humalog with every meal in 8/14 but has not followed up since then She was told to increase the dose to 12-14 units because of higher postprandial readings However her A1c is still significantly high Postprandial readings are quite variable and her median reading midday is 198 and late evening 201 She now says that she is taking her Humalog about 30 minutes after eating instead of before eating Also because of tendency to low normal readings at bedtime she is taking only 8 units  FASTING readings are overall fairly good although occasionally has high as 192   INSULIN: LANTUS 28 in a.m. and Humalog 8 units 3 times a day Monitors blood glucose:  2-3 times a day.         Glucometer:  One Touch     Blood Glucose readings: Before breakfast: Median about 135, overall highest readings are in the evenings although not clear which readings are postprandial Overall median 174 with highest being 319 at lunchtime        Hypoglycemia frequency:  none, she felt shaky when blood sugar was in the 70s    Meals: 3 meals per day.  has dinner at 6-7 PM        Physical activity: exercise: None recently      Dietician visit:  Most recent: Only at diagnosis          Currently does not follow any specific diet but is usually avoiding regular soft drinks Retinal exam: Most recent: > 1 Year ago            Oral hypoglycemic drugs the patient is taking are: Janumet Side effects from medications have been: None Compliance with the medical regimen has been  fair  The last HbgA1c was 9.7 in 05/2013, previously was >13 done on 03/31/13, before that on 12/16/12 it was 10.1 and has been consistently over 10% since 2008  Lab Results  Component Value Date   HGBA1C 8.6* 09/05/2013   HGBA1C 9.7* 06/06/2013   Lab Results  Component Value Date   MICROALBUR 6.1* 06/06/2013   CREATININE 1.16* 09/05/2013      Medication List       This list is accurate as of: 09/25/13  4:00 PM.  Always use your most recent med list.               dextromethorphan 30 MG/5ML liquid  Commonly known as:  DELSYM  Take 60 mg by mouth as needed for cough.     glucose blood test strip  Commonly known as:  ONE TOUCH ULTRA TEST  Use as instructed to check blood sugars  4 times per day     ibuprofen 800 MG tablet  Commonly known as:  ADVIL,MOTRIN  Take 1 tablet (800 mg total) by mouth every 8 (eight) hours as needed (mild pain).     insulin glargine 100 UNIT/ML injection  Commonly known as:  LANTUS  Inject 28 Units into the skin daily at 12 noon. Depends on blood glucose when checking at night     insulin lispro 100 UNIT/ML Sopn  Commonly known as:  HUMALOG KWIKPEN  Inject 10 units in the morning and 12 units in the evening.  Total of 22 units per day     iron polysaccharides 150 MG capsule  Commonly known as:  NU-IRON  Take 1 capsule (150 mg total) by mouth 2 (two) times daily.     linagliptin 5 MG Tabs tablet  Commonly known as:  TRADJENTA  Take 1 tablet (5 mg total) by mouth 2 (two) times daily with a meal.     LUBRICANT EYE DROPS 0.5 % Soln  Generic drug:  carboxymethylcellulose  1 drop 2 (two) times daily as needed.      medroxyPROGESTERone 10 MG tablet  Commonly known as:  PROVERA     metFORMIN 1000 MG tablet  Commonly known as:  GLUCOPHAGE  Take 1 tablet (1,000 mg total) by mouth 2 (two) times daily with a meal.     olmesartan-hydrochlorothiazide 40-12.5 MG per tablet  Commonly known as:  BENICAR HCT  Take 1 tablet by mouth daily.     oxyCODONE-acetaminophen 5-325 MG per tablet  Commonly known as:  PERCOCET/ROXICET  Take 1-2 tablets by mouth every 4 (four) hours as needed.     pravastatin 40 MG tablet  Commonly known as:  PRAVACHOL  Take 1 tablet (40 mg total) by mouth daily.     sitaGLIPtin-metformin 50-1000 MG per tablet  Commonly known as:  JANUMET  Take 1 tablet by mouth 2 (two) times daily with a meal.     vitamin C 1000 MG tablet  Take 1,000 mg by mouth daily.        Allergies:  Allergies  Allergen Reactions  . Iodinated Diagnostic Agents Nausea And Vomiting    Rxn to MRI dye 8/14  . Penicillins Itching    Childhood rxn ~47 years old.    Past Medical History  Diagnosis Date  . Diabetes mellitus   . Hypertension   . Pelvic pain in female   . Dysmenorrhea   . Fibroids   . Menorrhagia   . Yeast vaginitis   . Sickle cell trait   . Anemia   . S/P Robotic assisted Myomectomy (10/21) 09/06/2013    Past Surgical History  Procedure Laterality Date  . Hysteroscopy w/d&c    . Induced abortion    . Robot assisted myomectomy N/A 09/05/2013    Procedure: ROBOTIC ASSISTED MYOMECTOMY with morcellation;  Surgeon: Serita Kyle, MD;  Location: [facility];  Service: Gynecology;  Laterality: N/A;  3 hrs.  . Dilatation & currettage/hysteroscopy with resectocope N/A 09/05/2013    Procedure: DILATATION & CURETTAGE/HYSTEROSCOPY WITH RESECTOCOPE;  Surgeon: Serita Kyle, MD;  Location: [facility];  Service: Gynecology;  Laterality: N/A;    Family History  Problem Relation Age of Onset  . Hypertension Maternal Grandfather   . Diabetes Father   . Diabetes Mother   . Hypertension  Sister   . Heart disease Neg Hx    Social History:  reports that she has never smoked. She does not have any smokeless  tobacco history on file. She reports that she drinks alcohol. She reports that she does not use illicit drugs.    Review of Systems -   Hyperlipidemia: Most recent lipids:  Lab Results  Component Value Date   CHOL 202* 09/25/2013   HDL 50.50 09/25/2013   LDLDIRECT 129.1 09/25/2013   TRIG 275.0* 09/25/2013   CHOLHDL 4 09/25/2013   she was started on pravastatin but not clear why she is not taking this   She has had long-standing hypertension treated with Benicar HCT   No  history of numbness or tingling in her feet or toes   LABS:   Lab on 09/25/2013  Component Date Value Range Status  . Sodium 09/25/2013 137  135 - 145 mEq/L Final  . Potassium 09/25/2013 3.4* 3.5 - 5.1 mEq/L Final  . Chloride 09/25/2013 101  96 - 112 mEq/L Final  . CO2 09/25/2013 23  19 - 32 mEq/L Final  . Glucose, Bld 09/25/2013 143* 70 - 99 mg/dL Final  . BUN 16/08/9603 18  6 - 23 mg/dL Final  . Creatinine, Ser 09/25/2013 1.1  0.4 - 1.2 mg/dL Final  . Total Bilirubin 09/25/2013 0.2* 0.3 - 1.2 mg/dL Final  . Alkaline Phosphatase 09/25/2013 58  39 - 117 U/L Final  . AST 09/25/2013 22  0 - 37 U/L Final  . ALT 09/25/2013 23  0 - 35 U/L Final  . Total Protein 09/25/2013 7.4  6.0 - 8.3 g/dL Final  . Albumin 54/07/8118 3.5  3.5 - 5.2 g/dL Final  . Calcium 14/78/2956 9.2  8.4 - 10.5 mg/dL Final  . GFR 21/30/8657 68.21  >60.00 mL/min Final  . Cholesterol 09/25/2013 202* 0 - 200 mg/dL Final   ATP III Classification       Desirable:  < 200 mg/dL               Borderline High:  200 - 239 mg/dL          High:  > = 846 mg/dL  . Triglycerides 09/25/2013 275.0* 0.0 - 149.0 mg/dL Final   Normal:  <962 mg/dLBorderline High:  150 - 199 mg/dL  . HDL 09/25/2013 50.50  >39.00 mg/dL Final  . VLDL 95/28/4132 55.0* 0.0 - 40.0 mg/dL Final  . Total CHOL/HDL Ratio 09/25/2013 4   Final                  Men           Women1/2 Average Risk     3.4          3.3Average Risk          5.0          4.42X Average Risk          9.6          7.13X Average Risk          15.0          11.0                      . Direct LDL 09/25/2013 129.1   Final   Optimal:  <100 mg/dLNear or Above Optimal:  100-129 mg/dLBorderline High:  130-159 mg/dLHigh:  160-189 mg/dLVery High:  >190 mg/dL      Physical Examination:  BP 130/86  Pulse 89  Temp(Src) 98.4 F (36.9 C)  Resp 12  Ht 5\' 4"  (1.626 m)  Wt 172 lb 3.2 oz (78.109 kg)  BMI 29.54 kg/m2  SpO2 96%  LMP 09/18/2013  Feet are normal to inspection except for mild toenail fungus distally on the left   ASSESSMENT/PLAN:  Diabetes type 2, uncontrolled - 250.02     Her diabetes is again poorly controlled with overall median reading 174 and blood sugars as high as 318 She is now requiring a basal bolus regimen with Lantus and Humalog with every meal Most of her high readings are postprandial especially after breakfast and lunch and variable after supper Although she thinks she has had hypoglycemia she has only one low normal reading at bedtime Her hyperglycemia is primarily related to inadequate mealtime dose Also she is now stating that she is taking her mealtime insulin about 30 minutes or more after eating and not understanding the time course of action of Humalog insulin Discussed the correct timing of insulin and tendency to hypoglycemia 4-5 hours later if dosing postprandially FASTING blood sugars are overall better but still variable and probably indicates adequate doses of Lantus  Advised her to change her insulin regimen and take extra 2-4 units for large meals of Humalog She will keep a food, insulin and blood sugar record of pre-and post meal readings for review with the nurse educator She will be scheduled for diabetes education and will have her insulin reviewed at that time also along with meal planning  Also demonstrated her how to mark her postprandial  readings on her meter  Total visit time including counseling = 25 minutes  Cadarius Nevares 09/25/2013, 4:00 PM   Addendum: LDL 129, patient will be called about restarting pravastatin Potassium 3.4 from HCTZ, needs 20 mEq potassium daily

## 2013-09-26 ENCOUNTER — Telehealth: Payer: Self-pay | Admitting: Endocrinology

## 2013-09-26 LAB — LDL CHOLESTEROL, DIRECT: Direct LDL: 129.1 mg/dL

## 2013-09-26 NOTE — Telephone Encounter (Signed)
Her labs show 1. Low potassium level, needs to start K- Dur 20 mEq daily 2. Significantly high cholesterol and triglyceride levels, not clear why she is not taking her pravastatin, needs to start taking this daily

## 2013-09-29 ENCOUNTER — Other Ambulatory Visit: Payer: Self-pay | Admitting: *Deleted

## 2013-09-29 MED ORDER — PRAVASTATIN SODIUM 40 MG PO TABS: 40.0000 mg | ORAL_TABLET | Freq: Every day | ORAL | Status: DC

## 2013-09-29 MED ORDER — POTASSIUM CHLORIDE CRYS ER 20 MEQ PO TBCR: 20.0000 meq | EXTENDED_RELEASE_TABLET | Freq: Every day | ORAL | Status: DC

## 2013-09-29 NOTE — Telephone Encounter (Signed)
Results given, rx's have been sent

## 2013-10-09 ENCOUNTER — Encounter: Payer: Self-pay | Admitting: Nutrition

## 2013-10-09 ENCOUNTER — Encounter: Payer: 59 | Attending: Endocrinology | Admitting: Nutrition

## 2013-10-09 DIAGNOSIS — Z713 Dietary counseling and surveillance: Secondary | ICD-10-CM | POA: Insufficient documentation

## 2013-10-09 DIAGNOSIS — IMO0001 Reserved for inherently not codable concepts without codable children: Secondary | ICD-10-CM | POA: Insufficient documentation

## 2013-10-09 NOTE — Patient Instructions (Signed)
1.  Test blood sugars at HS.  Do not take a snack tonight if blood sugar is above 100.  Test blood sugar at 4AM and again in the AM.  If blood sugar is less than 80, reduce the Lantus dose 2u.   2.  Take 1-2u more of Humalog if eating more carbs. 3.  Reduce the dose of Humalog  By 1-2u when more active after the meal, or eating less.

## 2013-10-09 NOTE — Progress Notes (Signed)
Pt. Reports that she had surgery 1 month ago, and has not felt good, with HA and some upset stomach almost every day since.   She brought her meter.  Download shows FBSs for the last 7 days is:  106-193, acS: 140-155, HS: 100-145  Says that she must have a bedtime snack of yogurt if her blood sugar is 110 or below, or she will drop low. Insulin dose:  Lantus:  28u at 10AM,  Humalog 10-12u.  Says she take 12u when blood sugar is high before the meal.  She does not adjust the dose on meal size.   We discussed what carbs are, and the idea behind the need for more Humalog when eating more carbs. Discussed the need to take 1-2 units more when eating more food, and the need to reduce the dose by 1-2 u when eating less carbs.  We reviewed what carbs were, and she reported good understanding of this.  Her diet history was done 3 weeks ago. It was very vague with amounts of foods eaten, and blood sugars were mostly in the 200s.   She was drinking sweet drinks, but denies this now.  Stongly encouraged her not to do this.

## 2013-11-22 ENCOUNTER — Telehealth: Payer: Self-pay | Admitting: *Deleted

## 2013-11-22 NOTE — Telephone Encounter (Signed)
May send the last note

## 2013-11-22 NOTE — Telephone Encounter (Signed)
faxed

## 2013-11-22 NOTE — Telephone Encounter (Signed)
Dana Fowler from [facility] called to see if patient had kept her appt, she said they have never received office notes from her visits, okay to send these to them? Fax # [phone]

## 2013-11-28 ENCOUNTER — Other Ambulatory Visit: Payer: 59

## 2013-12-01 ENCOUNTER — Ambulatory Visit: Payer: 59 | Admitting: Endocrinology

## 2013-12-12 ENCOUNTER — Other Ambulatory Visit: Payer: 59

## 2013-12-19 ENCOUNTER — Ambulatory Visit: Payer: 59 | Admitting: Endocrinology

## 2014-03-27 ENCOUNTER — Ambulatory Visit: Payer: 59 | Attending: Internal Medicine | Admitting: Physical Therapy

## 2014-03-27 DIAGNOSIS — I10 Essential (primary) hypertension: Secondary | ICD-10-CM | POA: Diagnosis not present

## 2014-03-27 DIAGNOSIS — IMO0001 Reserved for inherently not codable concepts without codable children: Secondary | ICD-10-CM | POA: Diagnosis present

## 2014-03-27 DIAGNOSIS — E119 Type 2 diabetes mellitus without complications: Secondary | ICD-10-CM | POA: Diagnosis not present

## 2014-03-27 DIAGNOSIS — M2550 Pain in unspecified joint: Secondary | ICD-10-CM | POA: Diagnosis not present

## 2014-04-03 ENCOUNTER — Ambulatory Visit: Payer: 59

## 2014-04-10 ENCOUNTER — Ambulatory Visit: Payer: 59

## 2014-04-10 DIAGNOSIS — IMO0001 Reserved for inherently not codable concepts without codable children: Secondary | ICD-10-CM | POA: Diagnosis not present

## 2014-04-17 ENCOUNTER — Ambulatory Visit: Payer: 59 | Attending: Internal Medicine

## 2014-04-17 DIAGNOSIS — E119 Type 2 diabetes mellitus without complications: Secondary | ICD-10-CM | POA: Diagnosis not present

## 2014-04-17 DIAGNOSIS — M2550 Pain in unspecified joint: Secondary | ICD-10-CM | POA: Insufficient documentation

## 2014-04-17 DIAGNOSIS — IMO0001 Reserved for inherently not codable concepts without codable children: Secondary | ICD-10-CM | POA: Insufficient documentation

## 2014-04-17 DIAGNOSIS — I10 Essential (primary) hypertension: Secondary | ICD-10-CM | POA: Insufficient documentation

## 2014-04-19 ENCOUNTER — Ambulatory Visit: Payer: 59

## 2014-04-19 DIAGNOSIS — IMO0001 Reserved for inherently not codable concepts without codable children: Secondary | ICD-10-CM | POA: Diagnosis not present

## 2014-04-23 ENCOUNTER — Ambulatory Visit: Payer: 59

## 2014-04-23 DIAGNOSIS — IMO0001 Reserved for inherently not codable concepts without codable children: Secondary | ICD-10-CM | POA: Diagnosis not present

## 2014-04-27 ENCOUNTER — Ambulatory Visit: Payer: 59

## 2014-04-27 DIAGNOSIS — IMO0001 Reserved for inherently not codable concepts without codable children: Secondary | ICD-10-CM | POA: Diagnosis not present

## 2014-05-01 ENCOUNTER — Ambulatory Visit: Payer: 59

## 2014-05-01 DIAGNOSIS — IMO0001 Reserved for inherently not codable concepts without codable children: Secondary | ICD-10-CM | POA: Diagnosis not present

## 2014-05-03 ENCOUNTER — Ambulatory Visit: Payer: 59

## 2014-05-03 DIAGNOSIS — IMO0001 Reserved for inherently not codable concepts without codable children: Secondary | ICD-10-CM | POA: Diagnosis not present

## 2014-05-08 ENCOUNTER — Other Ambulatory Visit: Payer: Self-pay | Admitting: Endocrinology

## 2014-05-17 ENCOUNTER — Other Ambulatory Visit: Payer: Self-pay

## 2014-05-17 DIAGNOSIS — Z1231 Encounter for screening mammogram for malignant neoplasm of breast: Secondary | ICD-10-CM

## 2014-05-28 ENCOUNTER — Ambulatory Visit
Admission: RE | Admit: 2014-05-28 | Discharge: 2014-05-28 | Disposition: A | Discharge status: Home / Self Care | Payer: 59 | Source: Ambulatory Visit

## 2014-05-28 DIAGNOSIS — Z1231 Encounter for screening mammogram for malignant neoplasm of breast: Secondary | ICD-10-CM

## 2014-06-01 ENCOUNTER — Telehealth: Payer: Self-pay

## 2014-06-01 NOTE — Telephone Encounter (Signed)
LVM for pt to call back and schedule a1c fu w/Kumar, MD  Diabetic Bundle pt.

## 2014-07-09 ENCOUNTER — Other Ambulatory Visit: Payer: Self-pay | Admitting: Endocrinology

## 2014-07-10 ENCOUNTER — Telehealth: Payer: Self-pay | Admitting: *Deleted

## 2014-07-10 NOTE — Telephone Encounter (Signed)
Patient does not want to schedule appt for diabetic bundle

## 2014-07-25 ENCOUNTER — Other Ambulatory Visit: Payer: Self-pay | Admitting: Endocrinology

## 2014-08-02 ENCOUNTER — Other Ambulatory Visit: Payer: Self-pay | Admitting: Endocrinology

## 2014-09-17 ENCOUNTER — Encounter: Payer: Self-pay | Admitting: Nutrition

## 2015-05-13 ENCOUNTER — Other Ambulatory Visit: Payer: Self-pay

## 2015-07-15 ENCOUNTER — Other Ambulatory Visit: Payer: Self-pay | Admitting: General Surgery

## 2015-07-15 DIAGNOSIS — Z9889 Other specified postprocedural states: Secondary | ICD-10-CM

## 2015-07-19 ENCOUNTER — Other Ambulatory Visit: Payer: Self-pay

## 2015-07-19 DIAGNOSIS — R1900 Intra-abdominal and pelvic swelling, mass and lump, unspecified site: Secondary | ICD-10-CM

## 2015-07-24 ENCOUNTER — Other Ambulatory Visit: Payer: Self-pay

## 2015-07-24 DIAGNOSIS — R1900 Intra-abdominal and pelvic swelling, mass and lump, unspecified site: Secondary | ICD-10-CM

## 2015-07-26 ENCOUNTER — Ambulatory Visit
Admission: RE | Admit: 2015-07-26 | Discharge: 2015-07-26 | Disposition: A | Discharge status: Home / Self Care | Payer: 59 | Source: Ambulatory Visit | Attending: Surgery | Admitting: Surgery

## 2015-07-26 ENCOUNTER — Other Ambulatory Visit: Payer: Self-pay

## 2016-03-16 ENCOUNTER — Other Ambulatory Visit: Payer: Self-pay

## 2016-03-16 DIAGNOSIS — Z1231 Encounter for screening mammogram for malignant neoplasm of breast: Secondary | ICD-10-CM

## 2016-04-01 ENCOUNTER — Ambulatory Visit
Admission: RE | Admit: 2016-04-01 | Discharge: 2016-04-01 | Disposition: A | Discharge status: Home / Self Care | Payer: 59 | Source: Ambulatory Visit

## 2016-04-01 DIAGNOSIS — Z1231 Encounter for screening mammogram for malignant neoplasm of breast: Secondary | ICD-10-CM

## 2016-04-28 ENCOUNTER — Other Ambulatory Visit: Payer: Self-pay | Admitting: Internal Medicine

## 2016-04-28 ENCOUNTER — Ambulatory Visit
Admission: RE | Admit: 2016-04-28 | Discharge: 2016-04-28 | Disposition: A | Discharge status: Home / Self Care | Payer: 59 | Source: Ambulatory Visit | Attending: Internal Medicine | Admitting: Internal Medicine

## 2016-04-28 DIAGNOSIS — M25551 Pain in right hip: Secondary | ICD-10-CM

## 2016-06-02 ENCOUNTER — Encounter: Payer: Self-pay | Admitting: Gastroenterology

## 2016-06-10 ENCOUNTER — Other Ambulatory Visit: Payer: Self-pay | Admitting: Internal Medicine

## 2016-06-10 DIAGNOSIS — E2839 Other primary ovarian failure: Secondary | ICD-10-CM

## 2016-06-24 ENCOUNTER — Ambulatory Visit
Admission: RE | Admit: 2016-06-24 | Discharge: 2016-06-24 | Disposition: A | Discharge status: Home / Self Care | Payer: 59 | Source: Ambulatory Visit | Attending: Internal Medicine | Admitting: Internal Medicine

## 2016-06-24 DIAGNOSIS — E2839 Other primary ovarian failure: Secondary | ICD-10-CM

## 2016-06-25 ENCOUNTER — Ambulatory Visit (AMBULATORY_SURGERY_CENTER): Payer: Self-pay | Admitting: *Deleted

## 2016-06-25 VITALS — Ht 64.0 in | Wt 187.0 lb

## 2016-06-25 DIAGNOSIS — Z1211 Encounter for screening for malignant neoplasm of colon: Secondary | ICD-10-CM

## 2016-06-25 MED ORDER — NA SULFATE-K SULFATE-MG SULF 17.5-3.13-1.6 GM/177ML PO SOLN: ORAL | 0 refills | Status: DC

## 2016-06-25 NOTE — Progress Notes (Signed)
Patient denies any allergies to eggs or soy. Patient denies any problems with anesthesia/sedation. Patient denies any oxygen use at home and does not take any diet/weight loss medications.  

## 2016-07-01 ENCOUNTER — Encounter: Payer: Self-pay | Admitting: Gastroenterology

## 2016-07-01 ENCOUNTER — Telehealth: Payer: Self-pay | Admitting: Gastroenterology

## 2016-07-01 NOTE — Telephone Encounter (Signed)
Patient wants to let us know she has a right hernia from the sx she had back in 2014. She states it has gone down in size, and she did not want sx on the hernia. Pt states it does not cause any problems "its just there". Told patient she should be fine to go ahead with colonoscopy as planned. But to call us back if she has any problems.

## 2016-07-13 ENCOUNTER — Encounter: Payer: Self-pay | Admitting: Gastroenterology

## 2016-07-23 ENCOUNTER — Telehealth: Payer: Self-pay | Admitting: Gastroenterology

## 2016-07-23 NOTE — Telephone Encounter (Signed)
A user error has taken place.

## 2016-07-24 ENCOUNTER — Encounter: Payer: Self-pay | Admitting: Gastroenterology

## 2016-07-24 ENCOUNTER — Ambulatory Visit (AMBULATORY_SURGERY_CENTER): Payer: 59 | Admitting: Gastroenterology

## 2016-07-24 VITALS — BP 135/82 | HR 84 | Temp 96.8°F | Resp 16 | Ht 64.0 in | Wt 187.0 lb

## 2016-07-24 DIAGNOSIS — Z1211 Encounter for screening for malignant neoplasm of colon: Secondary | ICD-10-CM

## 2016-07-24 DIAGNOSIS — D123 Benign neoplasm of transverse colon: Secondary | ICD-10-CM

## 2016-07-24 MED ORDER — SODIUM CHLORIDE 0.9 % IV SOLN: 500.0000 mL | INTRAVENOUS | Status: DC

## 2016-07-24 NOTE — Op Note (Signed)
[place] Patient Name: Dana Fowler Procedure Date: 07/24/2016 7:32 AM MRN: [id number] Endoscopist: Mallie Mussel L. Loletha Carrow , MD Age: 50 Referring MD:  Date of Birth: 12-03-1965 Gender: Female Account #: [id number] Procedure:                Colonoscopy Indications:              Screening for colorectal malignant neoplasm, This                            is the patient's first colonoscopy Medicines:                Monitored Anesthesia Care Procedure:                Pre-Anesthesia Assessment:                           - Prior to the procedure, a History and Physical                            was performed, and patient medications and                            allergies were reviewed. The patient's tolerance of                            previous anesthesia was also reviewed. The risks                            and benefits of the procedure and the sedation                            options and risks were discussed with the patient.                            All questions were answered, and informed consent                            was obtained. Prior Anticoagulants: The patient has                            taken no previous anticoagulant or antiplatelet                            agents. ASA Grade Assessment: III - A patient with                            severe systemic disease. After reviewing the risks                            and benefits, the patient was deemed in                            satisfactory condition to undergo the procedure.  After obtaining informed consent, the colonoscope                            was passed under direct vision. Throughout the                            procedure, the patient's blood pressure, pulse, and                            oxygen saturations were monitored continuously. The                            Model CF-HQ190L [id number]) scope was introduced                            through the anus and  advanced to the the cecum,                            identified by appendiceal orifice and ileocecal                            valve. The colonoscopy was performed without                            difficulty. The patient tolerated the procedure                            well. The quality of the bowel preparation was                            excellent. The ileocecal valve, appendiceal                            orifice, and rectum were photographed. The quality                            of the bowel preparation was evaluated using the                            BBPS [facility] Bowel Preparation Scale) with scores                            of: Right Colon = 3, Transverse Colon = 3 and Left                            Colon = 3 (entire mucosa seen well with no residual                            staining, small fragments of stool or opaque                            liquid). The total BBPS score equals 9. The bowel  preparation used was SUPREP. Scope In: 8:05:53 AM Scope Out: 8:18:28 AM Scope Withdrawal Time: 0 hours 9 minutes 53 seconds  Total Procedure Duration: 0 hours 12 minutes 35 seconds  Findings:                 The perianal and digital rectal examinations were                            normal.                           A few diverticula were found in the ascending colon.                           A 2 mm polyp was found in the distal transverse                            colon. The polyp was sessile. The polyp was removed                            with a piecemeal technique using a cold biopsy                            forceps. Resection and retrieval were complete.                           The exam was otherwise without abnormality on                            direct and retroflexion views. Complications:            No immediate complications. Estimated Blood Loss:     Estimated blood loss: none. Impression:               - Diverticulosis in the  ascending colon.                           - One 2 mm polyp in the distal transverse colon,                            removed piecemeal using a cold biopsy forceps.                            Resected and retrieved.                           - The examination was otherwise normal on direct                            and retroflexion views. Recommendation:           - Patient has a contact number available for                            emergencies. The signs and symptoms of potential  delayed complications were discussed with the                            patient. Return to normal activities tomorrow.                            Written discharge instructions were provided to the                            patient.                           - Resume previous diet.                           - Continue present medications.                           - Await pathology results.                           - Repeat colonoscopy is recommended for                            surveillance. The colonoscopy date will be                            determined after pathology results from today's                            exam become available for review. Aldahir Litaker L. Loletha Carrow, MD 07/24/2016 8:22:18 AM This report has been signed electronically.

## 2016-07-24 NOTE — Progress Notes (Signed)
No egg or soy allergy known to patient  No issues with past sedation with any surgeries  or procedures, no intubation problems  No diet pills per patient No home 02 use per patient  No blood thinners per patient  Pt denies issues with constipation  No A fib or A flutter   Pt states yesterday her blood sugars were in the 70's and 80's- drank juices -297 this am at 5 am - here was 197 Since tues some congestion and slight cough but no fever Started period Monday, pt is still spotting

## 2016-07-24 NOTE — Progress Notes (Signed)
A and O x3. Report to RN. Tolerated MAC anesthesia well. 

## 2016-07-24 NOTE — Patient Instructions (Signed)
YOU HAD AN ENDOSCOPIC PROCEDURE TODAY AT THE [facility]:   Refer to the procedure report that was given to you for any specific questions about what was found during the examination.  If the procedure report does not answer your questions, please call your gastroenterologist to clarify.  If you requested that your care partner not be given the details of your procedure findings, then the procedure report has been included in a sealed envelope for you to review at your convenience later.  YOU SHOULD EXPECT: Some feelings of bloating in the abdomen. Passage of more gas than usual.  Walking can help get rid of the air that was put into your GI tract during the procedure and reduce the bloating. If you had a lower endoscopy (such as a colonoscopy or flexible sigmoidoscopy) you may notice spotting of blood in your stool or on the toilet paper. If you underwent a bowel prep for your procedure, you may not have a normal bowel movement for a few days.  Please Note:  You might notice some irritation and congestion in your nose or some drainage.  This is from the oxygen used during your procedure.  There is no need for concern and it should clear up in a day or so.  SYMPTOMS TO REPORT IMMEDIATELY:   Following lower endoscopy (colonoscopy or flexible sigmoidoscopy):  Excessive amounts of blood in the stool  Significant tenderness or worsening of abdominal pains  Swelling of the abdomen that is new, acute  Fever of 100F or higher  For urgent or emergent issues, a gastroenterologist can be reached at any hour by calling [phone].   DIET:  We do recommend a small meal at first, but then you may proceed to your regular diet.  Drink plenty of fluids but you should avoid alcoholic beverages for 24 hours.  ACTIVITY:  You should plan to take it easy for the rest of today and you should NOT DRIVE or use heavy machinery until tomorrow (because of the sedation medicines used during the test).     FOLLOW UP: Our staff will call the number listed on your records the next business day following your procedure to check on you and address any questions or concerns that you may have regarding the information given to you following your procedure. If we do not reach you, we will leave a message.  However, if you are feeling well and you are not experiencing any problems, there is no need to return our call.  We will assume that you have returned to your regular daily activities without incident.  If any biopsies were taken you will be contacted by phone or by letter within the next 1-3 weeks.  Please call us at [phone] if you have not heard about the biopsies in 3 weeks.    SIGNATURES/CONFIDENTIALITY: You and/or your care partner have signed paperwork which will be entered into your electronic medical record.  These signatures attest to the fact that that the information above on your After Visit Summary has been reviewed and is understood.  Full responsibility of the confidentiality of this discharge information lies with you and/or your care-partner. 

## 2016-07-24 NOTE — Progress Notes (Signed)
Called to room to assist during endoscopic procedure.  Patient ID and intended procedure confirmed with present staff. Received instructions for my participation in the procedure from the performing physician.  

## 2016-07-27 ENCOUNTER — Telehealth: Payer: Self-pay | Admitting: *Deleted

## 2016-07-27 NOTE — Telephone Encounter (Signed)
No answer,left message on f/u call

## 2016-07-28 ENCOUNTER — Encounter: Payer: Self-pay | Admitting: Gastroenterology

## 2016-08-03 ENCOUNTER — Telehealth: Payer: Self-pay | Admitting: Gastroenterology

## 2016-08-03 NOTE — Telephone Encounter (Signed)
Patient reports that she had HA and back pain that started about 4 days after her colonoscopy.  She is still having HA "Temples" and low back pain.  She is advised that HA and back aches should not be related to procedure and she should see her primary care for these symptoms.  She will call back for any GI concerns.

## 2016-08-03 NOTE — Telephone Encounter (Signed)
Thanks for letting me know. I agree that this does not appear to be related to her uncomplicated colonoscopy, and I agree with the advice to contact primary care.

## 2016-10-15 ENCOUNTER — Other Ambulatory Visit: Payer: Self-pay | Admitting: Gastroenterology

## 2016-10-15 DIAGNOSIS — Z1211 Encounter for screening for malignant neoplasm of colon: Secondary | ICD-10-CM

## 2017-04-21 ENCOUNTER — Other Ambulatory Visit: Payer: Self-pay | Admitting: Internal Medicine

## 2017-04-21 DIAGNOSIS — Z1231 Encounter for screening mammogram for malignant neoplasm of breast: Secondary | ICD-10-CM

## 2017-05-12 ENCOUNTER — Ambulatory Visit
Admission: RE | Admit: 2017-05-12 | Discharge: 2017-05-12 | Disposition: A | Discharge status: Home / Self Care | Payer: 59 | Source: Ambulatory Visit | Attending: Internal Medicine | Admitting: Internal Medicine

## 2017-05-12 DIAGNOSIS — Z1231 Encounter for screening mammogram for malignant neoplasm of breast: Secondary | ICD-10-CM

## 2018-01-27 IMAGING — MG MM DIGITAL SCREENING BILAT
4 series · 4 of 4 positions shown · non-contrast
Comparison: Previous exam(s).

CLINICAL DATA: Screening.

EXAM:
DIGITAL SCREENING BILATERAL MAMMOGRAM WITH CAD

[R MLO]
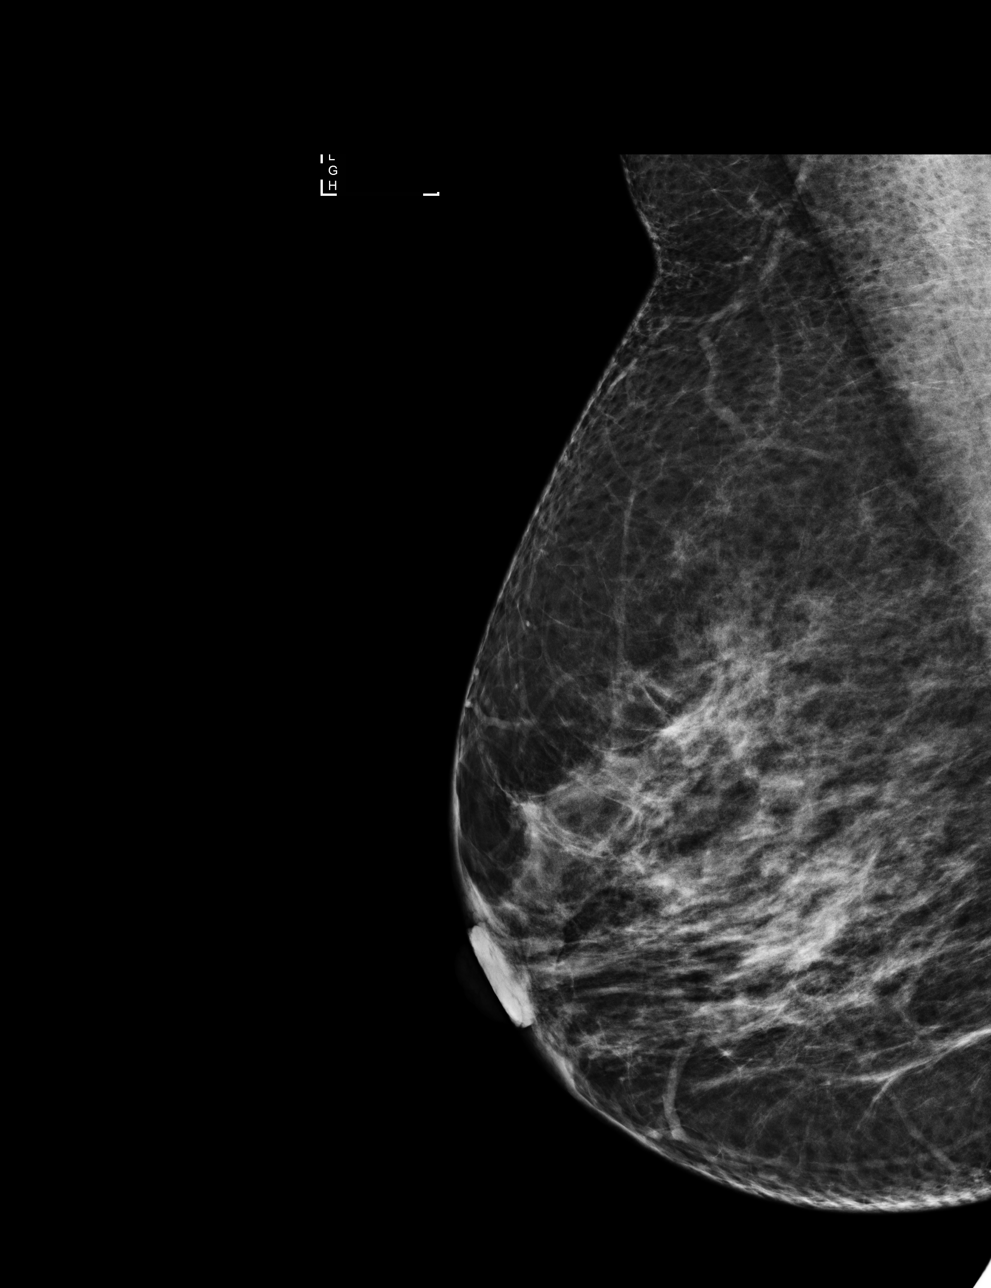

[L MLO]
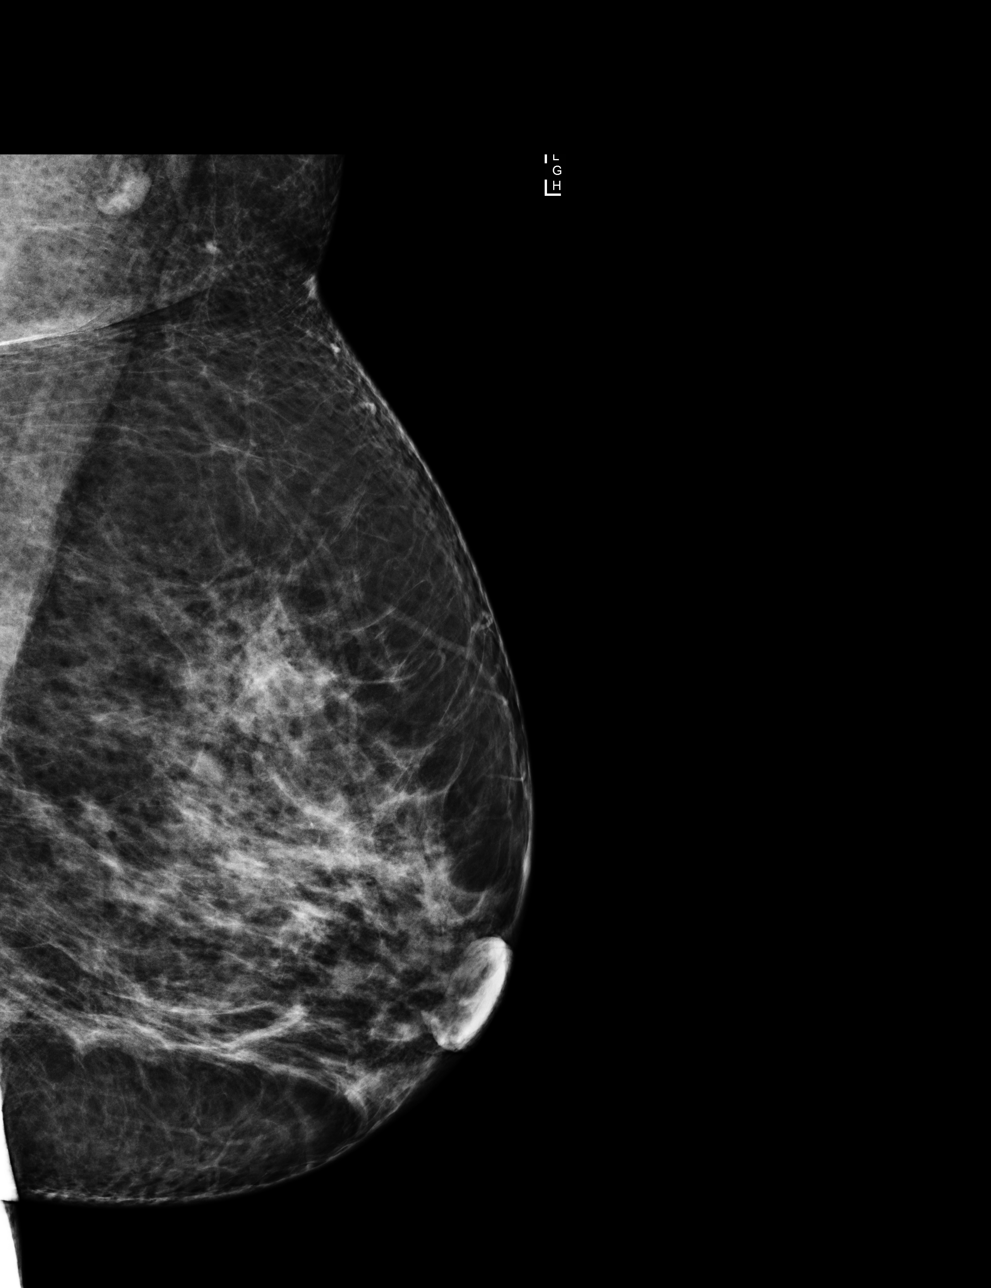

[R CC]
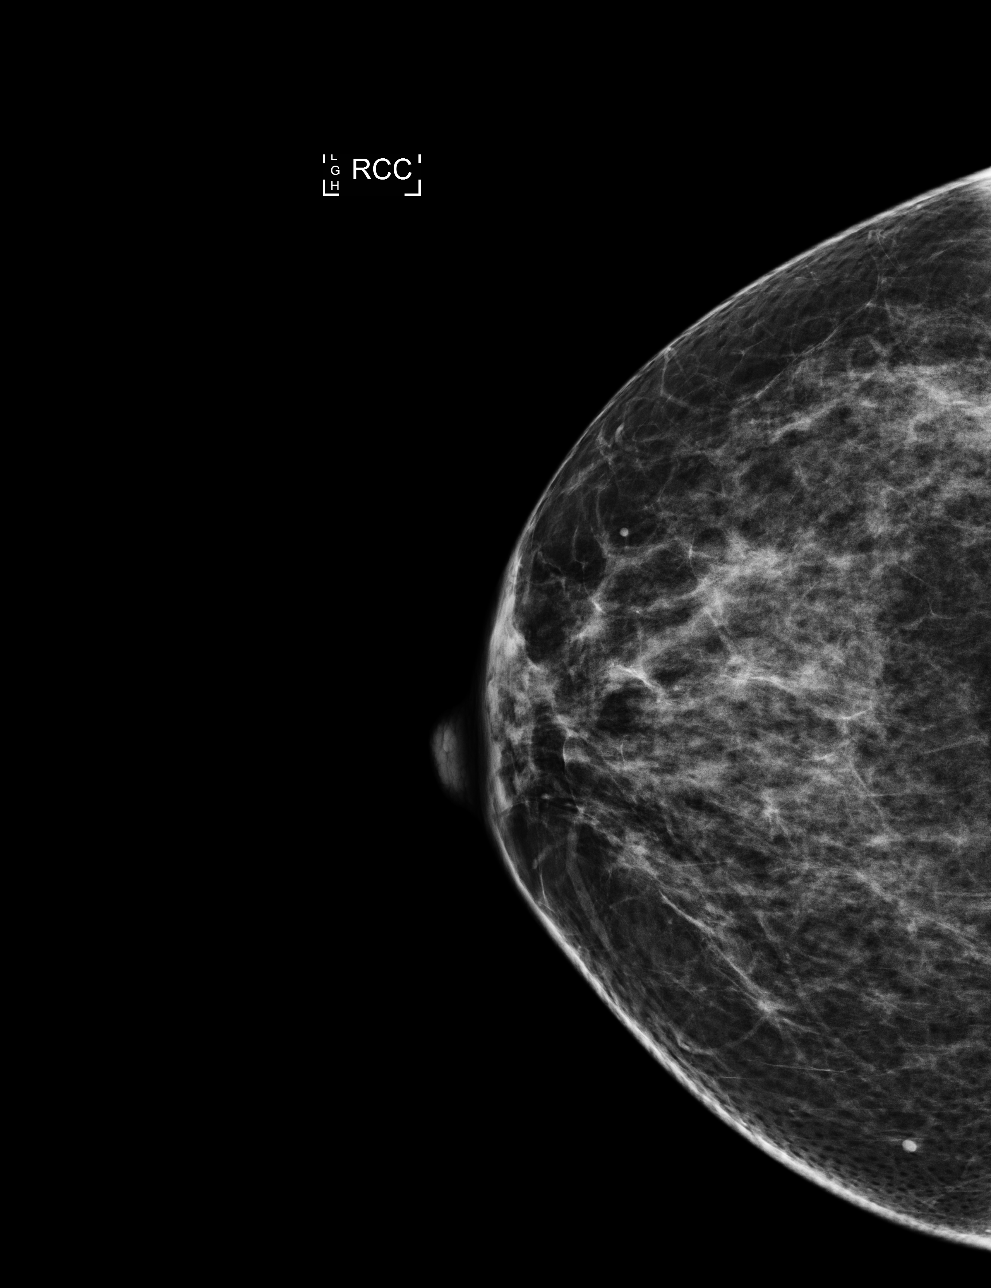

[L CC]
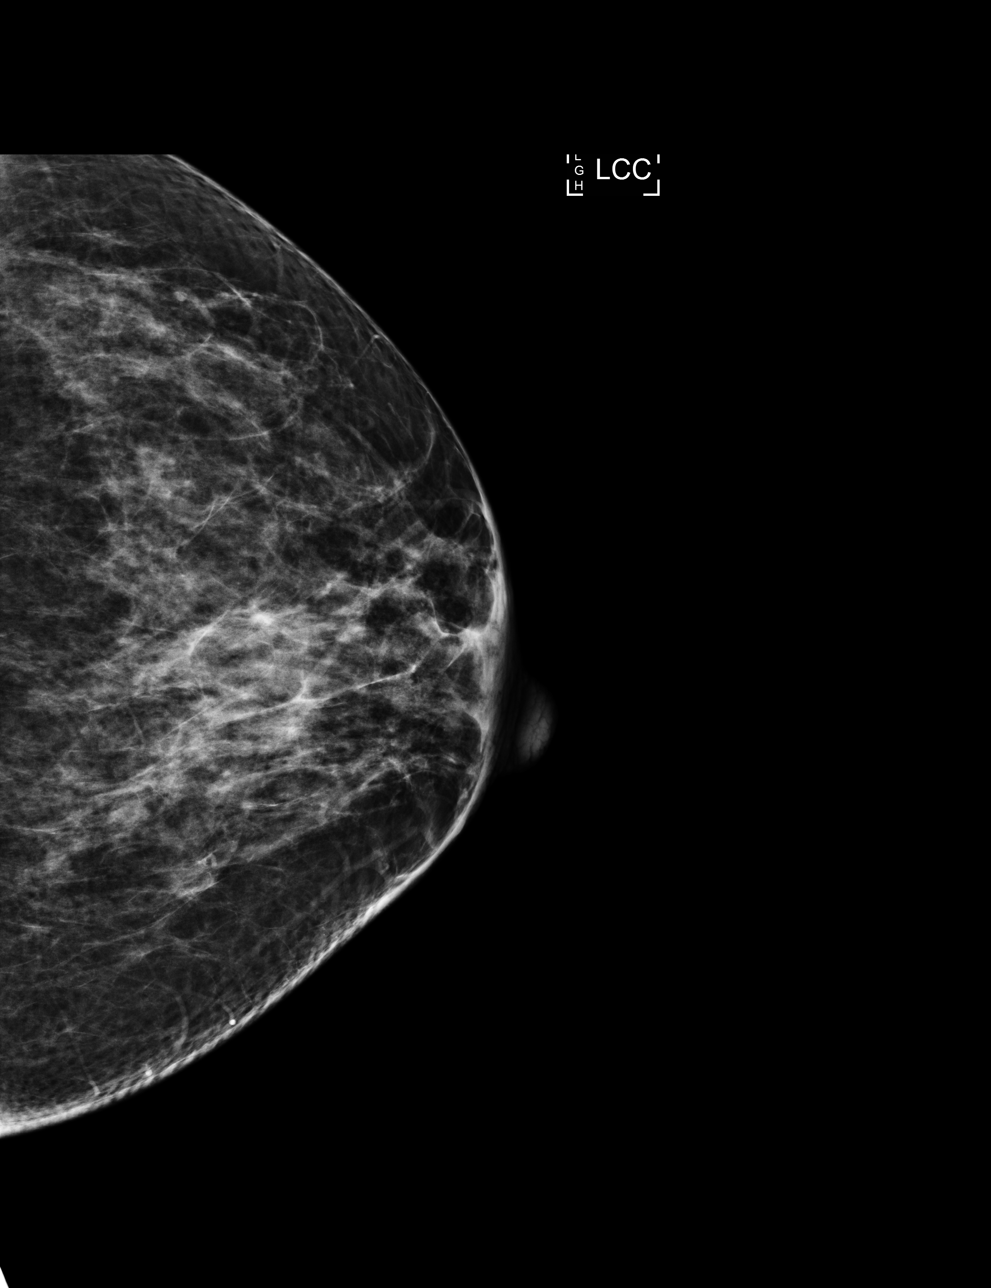

[4 of 4 positions shown; findings below may reference images not displayed]

ACR Breast Density Category c: The breast tissue is heterogeneously
dense, which may obscure small masses.
FINDINGS: There are no findings suspicious for malignancy. Images were
processed with CAD.
IMPRESSION: No mammographic evidence of malignancy. A result letter of this
screening mammogram will be mailed directly to the patient.

RECOMMENDATION:
Screening mammogram in one year. (Code:[id number])

BI-RADS CATEGORY  1: Negative.

## 2018-05-04 ENCOUNTER — Other Ambulatory Visit: Payer: Self-pay | Admitting: Internal Medicine

## 2018-05-04 DIAGNOSIS — E2839 Other primary ovarian failure: Secondary | ICD-10-CM

## 2018-05-13 ENCOUNTER — Other Ambulatory Visit: Payer: Self-pay | Admitting: Internal Medicine

## 2018-05-13 DIAGNOSIS — Z1231 Encounter for screening mammogram for malignant neoplasm of breast: Secondary | ICD-10-CM

## 2018-06-26 ENCOUNTER — Encounter (HOSPITAL_COMMUNITY): Payer: Self-pay | Admitting: Emergency Medicine

## 2018-06-26 ENCOUNTER — Other Ambulatory Visit: Payer: Self-pay

## 2018-06-26 ENCOUNTER — Emergency Department (HOSPITAL_COMMUNITY): Payer: 59

## 2018-06-26 ENCOUNTER — Observation Stay (HOSPITAL_COMMUNITY)
Admission: EM | Admit: 2018-06-26 | Discharge: 2018-06-27 | Disposition: A | Discharge status: Home / Self Care | Payer: 59 | Attending: Internal Medicine | Admitting: Internal Medicine

## 2018-06-26 DIAGNOSIS — E669 Obesity, unspecified: Secondary | ICD-10-CM | POA: Insufficient documentation

## 2018-06-26 DIAGNOSIS — J189 Pneumonia, unspecified organism: Principal | ICD-10-CM | POA: Diagnosis present

## 2018-06-26 DIAGNOSIS — Z6830 Body mass index (BMI) 30.0-30.9, adult: Secondary | ICD-10-CM | POA: Insufficient documentation

## 2018-06-26 DIAGNOSIS — N946 Dysmenorrhea, unspecified: Secondary | ICD-10-CM | POA: Diagnosis not present

## 2018-06-26 DIAGNOSIS — R002 Palpitations: Secondary | ICD-10-CM | POA: Insufficient documentation

## 2018-06-26 DIAGNOSIS — E11649 Type 2 diabetes mellitus with hypoglycemia without coma: Secondary | ICD-10-CM | POA: Insufficient documentation

## 2018-06-26 DIAGNOSIS — R05 Cough: Secondary | ICD-10-CM

## 2018-06-26 DIAGNOSIS — E118 Type 2 diabetes mellitus with unspecified complications: Secondary | ICD-10-CM

## 2018-06-26 DIAGNOSIS — R490 Dysphonia: Secondary | ICD-10-CM | POA: Diagnosis not present

## 2018-06-26 DIAGNOSIS — I959 Hypotension, unspecified: Secondary | ICD-10-CM | POA: Insufficient documentation

## 2018-06-26 DIAGNOSIS — Z794 Long term (current) use of insulin: Secondary | ICD-10-CM | POA: Diagnosis not present

## 2018-06-26 DIAGNOSIS — N179 Acute kidney failure, unspecified: Secondary | ICD-10-CM

## 2018-06-26 DIAGNOSIS — E162 Hypoglycemia, unspecified: Secondary | ICD-10-CM

## 2018-06-26 DIAGNOSIS — Z880 Allergy status to penicillin: Secondary | ICD-10-CM | POA: Diagnosis not present

## 2018-06-26 DIAGNOSIS — I10 Essential (primary) hypertension: Secondary | ICD-10-CM | POA: Insufficient documentation

## 2018-06-26 DIAGNOSIS — R059 Cough, unspecified: Secondary | ICD-10-CM

## 2018-06-26 DIAGNOSIS — D573 Sickle-cell trait: Secondary | ICD-10-CM | POA: Diagnosis not present

## 2018-06-26 LAB — CBC
HCT: 40.4 % (ref 36.0–46.0)
Hemoglobin: 12.4 g/dL (ref 12.0–15.0)
MCH: 25 pg — AB (ref 26.0–34.0)
MCHC: 30.7 g/dL (ref 30.0–36.0)
MCV: 81.5 fL (ref 78.0–100.0)
PLATELETS: 234 10*3/uL (ref 150–400)
RBC: 4.96 MIL/uL (ref 3.87–5.11)
RDW: 14.1 % (ref 11.5–15.5)
WBC: 6.4 10*3/uL (ref 4.0–10.5)

## 2018-06-26 LAB — BASIC METABOLIC PANEL
Anion gap: 11 (ref 5–15)
BUN: 17 mg/dL (ref 6–20)
CO2: 25 mmol/L (ref 22–32)
CREATININE: 1.35 mg/dL — AB (ref 0.44–1.00)
Calcium: 9 mg/dL (ref 8.9–10.3)
Chloride: 104 mmol/L (ref 98–111)
GFR calc non Af Amer: 44 mL/min — ABNORMAL LOW (ref >60)
GFR, EST AFRICAN AMERICAN: 51 mL/min — AB (ref >60)
Glucose, Bld: 71 mg/dL (ref 70–99)
Potassium: 3.6 mmol/L (ref 3.5–5.1)
Sodium: 140 mmol/L (ref 135–145)

## 2018-06-26 LAB — URINALYSIS, ROUTINE W REFLEX MICROSCOPIC
Bilirubin Urine: NEGATIVE
Glucose, UA: 50 mg/dL — AB
HGB URINE DIPSTICK: NEGATIVE
Ketones, ur: NEGATIVE mg/dL
Leukocytes, UA: NEGATIVE
Nitrite: NEGATIVE
PROTEIN: NEGATIVE mg/dL
Specific Gravity, Urine: 1.011 (ref 1.005–1.030)
pH: 6 (ref 5.0–8.0)

## 2018-06-26 LAB — POC URINE PREG, ED: Preg Test, Ur: NEGATIVE

## 2018-06-26 LAB — GLUCOSE, CAPILLARY: Glucose-Capillary: 222 mg/dL — ABNORMAL HIGH (ref 70–99)

## 2018-06-26 LAB — CBG MONITORING, ED
GLUCOSE-CAPILLARY: 54 mg/dL — AB (ref 70–99)
Glucose-Capillary: 80 mg/dL (ref 70–99)

## 2018-06-26 MED ORDER — AZITHROMYCIN 250 MG PO TABS
500.0000 mg | ORAL_TABLET | Freq: Once | ORAL | Status: AC
  Administered 2018-06-26: 500 mg via ORAL
  Filled 2018-06-26: qty 2

## 2018-06-26 MED ORDER — INSULIN GLARGINE 100 UNIT/ML ~~LOC~~ SOLN
30.0000 [IU] | Freq: Every day | SUBCUTANEOUS | Status: DC
  Administered 2018-06-27: 30 [IU] via SUBCUTANEOUS
  Filled 2018-06-26: qty 0.3

## 2018-06-26 MED ORDER — LACTATED RINGERS IV SOLN
INTRAVENOUS | Status: AC
  Administered 2018-06-26: 23:00:00 via INTRAVENOUS

## 2018-06-26 MED ORDER — DOXYCYCLINE HYCLATE 100 MG PO TABS
100.0000 mg | ORAL_TABLET | Freq: Two times a day (BID) | ORAL | Status: DC
  Administered 2018-06-27: 100 mg via ORAL
  Filled 2018-06-26: qty 1

## 2018-06-26 MED ORDER — SODIUM CHLORIDE 0.9 % IV BOLUS
1000.0000 mL | Freq: Once | INTRAVENOUS | Status: AC
  Administered 2018-06-26: 1000 mL via INTRAVENOUS

## 2018-06-26 MED ORDER — SODIUM CHLORIDE 0.9% FLUSH
3.0000 mL | Freq: Two times a day (BID) | INTRAVENOUS | Status: DC
  Administered 2018-06-27: 3 mL via INTRAVENOUS

## 2018-06-26 MED ORDER — ENOXAPARIN SODIUM 40 MG/0.4ML ~~LOC~~ SOLN
40.0000 mg | Freq: Every day | SUBCUTANEOUS | Status: DC
  Administered 2018-06-26: 40 mg via SUBCUTANEOUS
  Filled 2018-06-26: qty 0.4

## 2018-06-26 MED ORDER — DEXTROSE 50 % IV SOLN
12.5000 g | Freq: Once | INTRAVENOUS | Status: AC
  Administered 2018-06-26: 12.5 g via INTRAVENOUS
  Filled 2018-06-26: qty 50

## 2018-06-26 MED ORDER — INSULIN ASPART 100 UNIT/ML ~~LOC~~ SOLN
0.0000 [IU] | Freq: Three times a day (TID) | SUBCUTANEOUS | Status: DC
  Administered 2018-06-27: 3 [IU] via SUBCUTANEOUS
  Administered 2018-06-27: 2 [IU] via SUBCUTANEOUS

## 2018-06-26 MED ORDER — INSULIN ASPART 100 UNIT/ML ~~LOC~~ SOLN
3.0000 [IU] | Freq: Three times a day (TID) | SUBCUTANEOUS | Status: DC
  Administered 2018-06-27 (×2): 3 [IU] via SUBCUTANEOUS

## 2018-06-26 NOTE — ED Triage Notes (Signed)
Pt reports weakness since yesterday. Pt reports palpitations starting today. Pt reports non-productive cough x 1 month. Pt denies fever, CP, SHOB.

## 2018-06-26 NOTE — H&P (Signed)
History and Physical   Dana Fowler LEX:[id number] DOB: 13-Mar-1966 DOA: 06/26/2018  PCP: Nolene Ebbs, MD  Chief Complaint: cough  HPI: this is a 52 year old woman with medical problems including obesity, insulin-dependent diabetes, hypertension presented with a chief complaint of cough. History is obtained via chart review, as well as patient history.  She reports onset of non-productive cough about one week ago, not improving. She reports worsening weakness over the past 24 hours and prompted admission. She denies any fevers, chills, PND, orthopnea, lower extremity edema, chest pain. She denies any recent antibiotic use. She reports waking up typically on most nights between 1 AM and 4 AM and check her blood sugar which is typically been less than 60, she drinks juice or eat snack to help bring her blood sugars back within normal limits. She reports having sweats, denies syncope. She denies any sick contacts, no hemoptysis.  She lives alone, does not have any pets, is not a smoker, does not drink alcohol. Originally she is from [place], but has lived in [place] for over decade. She works as a Orthoptist. She reports regarding her diabetes that her A1c was over 10 when last checked, she reports taking basal insulin 30 units twice a day, her last dose of Lantus was the morning prior to admission. She reports taking rapid acting insulin 2-3 times a day ranging from 8-12 units. She also reports taking metformin.  Of note, also reports hypotensive episodes at home prior to admission, with SBP ~100 mm Hg which is atypical for her.  ED Course: course remarkable for vital signs with heart rate 102, respiratory 21, systolic blood pressure within normal. Diagnostics revealed normal white blood cell count, heel and toe 0.4. Creatinine 1.35.  Chest x-ray revealed diffuse interstitial pattern concerning for atypical infection.  Blood sugar dropped to 54 given D50. Also given normal saline 1 L  and by mouth azithromycin for treatment of pneumonia. Hospital medicine consult further management.  Review of Systems: A complete ROS was obtained; pertinent positives negatives are denoted in the HPI.  Positive for snoring.Otherwise, all systems are negative.   Past Medical History:  Diagnosis Date  . Anemia   . Blood transfusion without reported diagnosis   . Diabetes mellitus   . Dysmenorrhea   . Fibroids   . Hernia, inguinal, right    pt states she got this with sx in 2014  . Hypertension   . Menorrhagia   . Pelvic pain in female   . 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 Sickle cell trait ([place])   . Yeast vaginitis    Social History   Socioeconomic History  . Marital status: Single    Spouse name: Not on file  . Number of children: Not on file  . Years of education: Not on file  . Highest education level: Not on file  Occupational History  . Not on file  Social Needs  . Financial resource strain: Not on file  . Food insecurity:    Worry: Not on file    Inability: Not on file  . Transportation needs:    Medical: Not on file    Non-medical: Not on file  Tobacco Use  . Smoking status: Never Smoker  . Smokeless tobacco: Never Used  Substance and Sexual Activity  . Alcohol use: No    Comment: RARE  . Drug use: No  . Sexual activity: Not on file  Lifestyle  . Physical activity:    Days per  week: Not on file    Minutes per session: Not on file  . Stress: Not on file  Relationships  . Social connections:    Talks on phone: Not on file    Gets together: Not on file    Attends religious service: Not on file    Active member of club or organization: Not on file    Attends meetings of clubs or organizations: Not on file    Relationship status: Not on file  . Intimate partner violence:    Fear of current or ex partner: Not on file    Emotionally abused: Not on file    Physically abused: Not on file    Forced sexual activity: Not on file  Other  Topics Concern  . Not on file  Social History Narrative  . Not on file   Family History  Problem Relation Age of Onset  . Hypertension Maternal Grandfather   . Diabetes Father   . Diabetes Mother   . Hypertension Sister   . Heart disease Neg Hx   . Colon cancer Neg Hx     Physical Exam: Vitals:   06/26/18 1830 06/26/18 1900 06/26/18 2000 06/26/18 2030  BP: 106/67 113/75 131/84 125/84  Pulse: 88 77 80 78  Resp: 16 (!) 21 17 17   Temp:      TempSrc:      SpO2: 100% 99% 100% 99%  Weight:      Height:       General: Appears calm and comfortable. Obese black woman. ENT: Grossly normal hearing, MMM. Cardiovascular: RRR. No M/R/G. No LE edema.  Respiratory: CTA bilaterally. No wheezes or crackles. Normal respiratory effort. Breathing room air. Abdomen: Soft, non-tender. No guarding or rebound. Skin: No rash or induration seen on limited exam. Musculoskeletal: Grossly normal tone BUE/BLE. Appropriate ROM.  Psychiatric: Grossly normal mood and affect. Neurologic: Moves all extremities in coordinated fashion.  I have personally reviewed the following labs, culture data, and imaging studies.  Assessment/Plan:  #Pneumonia, bacterial suspected Course: presented with 1 week of non-productive cough and progressive fatigue, CXR c/w pneumonia, concern for atypical pathogens Plan: will continue PO antimicrobial coverage, but will adjust to doxycycline therapy, RVP to evaluate for underlying etiology  #Diabetes, insulin dependent with hypoglycemia Course: sounds like symptomatic nightly episodes of hypoglycemia (BS < 60 on a regular basis), blood sugar on admission of 54 mg/dL; suspect possibly iatrogenic related with reported home regimen being 60 units of basal insulin along with 2-3 x daily rapid acting insulin with PO DM regimen. Plan: Will liberalized diet.  Cut basal insulin in half (30 units q day to start tomorrow AM, at least initially), update A1C, low dose rapid acting insulin  with meals along with correction factor, hold PO agents while in-house.  #Other problems: -Obesity: outpatient weight reduction -HTN: hold home ARB / HCTZ for now -AKI, possible: unclear baseline, however, Cr on admission was 1.35, will hydrate and re-assess with tomorrow AM labs  DVT prophylaxis: Subq Lovenox Code Status: Full code Disposition Plan: Anticipate D/C home as early as 8/12 AM Consults called: none Admission status: admit to hospital medicine service   Cheri Rous, MD [facility] Page:[phone]  If 7PM-7AM, please contact night-coverage [email or web address] Password [email or web address]

## 2018-06-26 NOTE — ED Notes (Signed)
Patient transported to X-ray 

## 2018-06-26 NOTE — ED Provider Notes (Signed)
[facility] Emergency Department Provider Note MRN:  [id number]  Arrival date & time: 06/27/18     Chief Complaint   Weakness and Palpitations   History of Present Illness   Dana Fowler is a 52 y.o. year-old female with a history of diabetes presenting to the ED with chief complaint of weakness.  Patient explains that for the past 1 to 2 weeks she has been experiencing a hoarse voice, described as "losing her voice".  For the past 2 days she has been experiencing persistent dry cough.  And now since yesterday afternoon she has been experiencing worsening fatigue, general malaise, generalized weakness, low energy.  Endorsing some intermittent palpitations today.  Denies recent fever, no chest pain, mild shortness of breath with exertion, no abdominal pain, no dysuria.  Explains that her insulin at home does not seem to be working.  Review of Systems  A complete 10 system review of systems was obtained and all systems are negative except as noted in the HPI and PMH.   Patient's Health History    Past Medical History:  Diagnosis Date  . Anemia   . Blood transfusion without reported diagnosis   . Diabetes mellitus   . Dysmenorrhea   . Fibroids   . Hernia, inguinal, right    pt states she got this with sx in 2014  . Hypertension   . Menorrhagia   . Pelvic pain in female   . 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 Sickle cell trait ([place])   . Yeast vaginitis     Past Surgical History:  Procedure Laterality Date  . DILATATION & CURRETTAGE/HYSTEROSCOPY WITH RESECTOCOPE N/A 09/05/2013   Procedure: DILATATION & CURETTAGE/HYSTEROSCOPY WITH RESECTOCOPE;  Surgeon: Marvene Staff, MD;  Location: [facility];  Service: Gynecology;  Laterality: N/A;  . HYSTEROSCOPY W/D&C    . INDUCED ABORTION    . ROBOT ASSISTED MYOMECTOMY N/A 09/05/2013   Procedure: ROBOTIC ASSISTED MYOMECTOMY with morcellation;  Surgeon: Marvene Staff, MD;  Location: [facility];   Service: Gynecology;  Laterality: N/A;  3 hrs.    Family History  Problem Relation Age of Onset  . Hypertension Maternal Grandfather   . Diabetes Father   . Diabetes Mother   . Hypertension Sister   . Heart disease Neg Hx   . Colon cancer Neg Hx     Social History   Socioeconomic History  . Marital status: Single    Spouse name: Not on file  . Number of children: Not on file  . Years of education: Not on file  . Highest education level: Not on file  Occupational History  . Not on file  Social Needs  . Financial resource strain: Not on file  . Food insecurity:    Worry: Not on file    Inability: Not on file  . Transportation needs:    Medical: Not on file    Non-medical: Not on file  Tobacco Use  . Smoking status: Never Smoker  . Smokeless tobacco: Never Used  Substance and Sexual Activity  . Alcohol use: No    Comment: RARE  . Drug use: No  . Sexual activity: Not on file  Lifestyle  . Physical activity:    Days per week: Not on file    Minutes per session: Not on file  . Stress: Not on file  Relationships  . Social connections:    Talks on phone: Not on file    Gets together: Not on file  Attends religious service: Not on file    Active member of club or organization: Not on file    Attends meetings of clubs or organizations: Not on file    Relationship status: Not on file  . Intimate partner violence:    Fear of current or ex partner: Not on file    Emotionally abused: Not on file    Physically abused: Not on file    Forced sexual activity: Not on file  Other Topics Concern  . Not on file  Social History Narrative  . Not on file     Physical Exam  Vital Signs and Nursing Notes reviewed Vitals:   06/26/18 2200 06/26/18 2255  BP: (!) 146/96 (!) 126/93  Pulse: 82 82  Resp: 13 18  Temp:  98.4 F (36.9 C)  SpO2: 99% 99%    CONSTITUTIONAL: Well-appearing, NAD NEURO:  Alert and oriented x 3, no focal deficits EYES:  eyes equal and  reactive ENT/NECK:  no LAD, no JVD CARDIO: Regular rate, well-perfused, normal S1 and S2 PULM:  CTAB no wheezing or rhonchi GI/GU:  normal bowel sounds, non-distended, non-tender MSK/SPINE:  No gross deformities, no edema SKIN:  no rash, atraumatic PSYCH:  Appropriate speech and behavior  Diagnostic and Interventional Summary    EKG Interpretation  Date/Time:  Sunday June 26 2018 16:49:25 EDT Ventricular Rate:  99 PR Interval:  118 QRS Duration: 70 QT Interval:  346 QTC Calculation: 444 R Axis:   13 Text Interpretation:  Sinus rhythm with occasional Premature ventricular complexes Biatrial enlargement Abnormal ECG Confirmed by Gerlene Fee [phone]) on 06/26/2018 5:14:43 PM      Labs Reviewed  BASIC METABOLIC PANEL - Abnormal; Notable for the following components:      Result Value   Creatinine, Ser 1.35 (*)    GFR calc non Af Amer 44 (*)    GFR calc Af Amer 51 (*)    All other components within normal limits  CBC - Abnormal; Notable for the following components:   MCH 25.0 (*)    All other components within normal limits  URINALYSIS, ROUTINE W REFLEX MICROSCOPIC - Abnormal; Notable for the following components:   Color, Urine STRAW (*)    Glucose, UA 50 (*)    All other components within normal limits  GLUCOSE, CAPILLARY - Abnormal; Notable for the following components:   Glucose-Capillary 222 (*)    All other components within normal limits  CBG MONITORING, ED - Abnormal; Notable for the following components:   Glucose-Capillary 54 (*)    All other components within normal limits  RESPIRATORY PANEL BY PCR  HIV ANTIBODY (ROUTINE TESTING)  HEMOGLOBIN A1C  COMPREHENSIVE METABOLIC PANEL  CBC  CBG MONITORING, ED  POC URINE PREG, ED    DG Chest 2 View  Final Result      Medications  enoxaparin (LOVENOX) injection 40 mg (40 mg Subcutaneous Given 06/26/18 2332)  sodium chloride flush (NS) 0.9 % injection 3 mL (3 mLs Intravenous Not Given 06/26/18 2303)  insulin  aspart (novoLOG) injection 0-15 Units (has no administration in time range)  insulin aspart (novoLOG) injection 3 Units (has no administration in time range)  insulin glargine (LANTUS) injection 30 Units (has no administration in time range)  lactated ringers infusion ( Intravenous New Bag/Given 06/26/18 2303)  doxycycline (VIBRA-TABS) tablet 100 mg (has no administration in time range)  sodium chloride 0.9 % bolus 1,000 mL (0 mLs Intravenous Stopped 06/26/18 2010)  dextrose 50 % solution 12.5 g (12.5  g Intravenous Given 06/26/18 1909)  azithromycin (ZITHROMAX) tablet 500 mg (500 mg Oral Given 06/26/18 2030)     Procedures Critical Care  ED Course and Medical Decision Making  I have reviewed the triage vital signs and the nursing notes.  Pertinent labs & imaging results that were available during my care of the patient were reviewed by me and considered in my medical decision making (see below for details).    52 year old female history of diabetes presenting with labile blood glucoses, what sounds like laryngitis, dry cough, malaise fatigue.  Vital signs stable, well-appearing.  Chest x-ray reveals patchy infiltrates which, and correlation with the history, is consistent with a walking pneumonia, atypical.  Given p.o. azithromycin.  Patient also has a mild AKI, and became hypoglycemic in the emergency department, required half amp of D50.  Will request admission to ensure continued hydration and close glucose monitoring.  Barth Kirks. [place] Small, [place] [email or web address]  Final Clinical Impressions(s) / ED Diagnoses     ICD-10-CM   1. Atypical pneumonia J18.9   2. Cough R05 DG Chest 2 View    DG Chest 2 View  3. Hypoglycemia E16.2   4. AKI (acute kidney injury) [facility]) N17.9     ED Discharge Orders    None         Maudie Flakes, MD 06/27/18 [phone]

## 2018-06-26 NOTE — ED Notes (Addendum)
EDP made aware of CBG pt given OJ and crackers.

## 2018-06-26 NOTE — Progress Notes (Signed)
Pt admitted from ED to 5w05. Pt A&Ox4. Pt lives at home alone. Pt oriented to room. Pt's skin is warm, dry and intact. Pt placed on droplet precautions. Told pt to call for assistance. Will continue to monitor pt. Ranelle Oyster, RN

## 2018-06-27 ENCOUNTER — Ambulatory Visit: Payer: 59

## 2018-06-27 ENCOUNTER — Other Ambulatory Visit: Payer: 59

## 2018-06-27 DIAGNOSIS — J189 Pneumonia, unspecified organism: Secondary | ICD-10-CM | POA: Diagnosis not present

## 2018-06-27 DIAGNOSIS — E162 Hypoglycemia, unspecified: Secondary | ICD-10-CM | POA: Diagnosis not present

## 2018-06-27 DIAGNOSIS — N179 Acute kidney failure, unspecified: Secondary | ICD-10-CM

## 2018-06-27 LAB — CBC
HCT: 36.1 % (ref 36.0–46.0)
Hemoglobin: 11.4 g/dL — ABNORMAL LOW (ref 12.0–15.0)
MCH: 25.5 pg — AB (ref 26.0–34.0)
MCHC: 31.6 g/dL (ref 30.0–36.0)
MCV: 80.8 fL (ref 78.0–100.0)
PLATELETS: 167 10*3/uL (ref 150–400)
RBC: 4.47 MIL/uL (ref 3.87–5.11)
RDW: 14 % (ref 11.5–15.5)
WBC: 4.2 10*3/uL (ref 4.0–10.5)

## 2018-06-27 LAB — COMPREHENSIVE METABOLIC PANEL
ALBUMIN: 3.1 g/dL — AB (ref 3.5–5.0)
ALT: 17 U/L (ref 0–44)
AST: 19 U/L (ref 15–41)
Alkaline Phosphatase: 65 U/L (ref 38–126)
Anion gap: 8 (ref 5–15)
BUN: 12 mg/dL (ref 6–20)
CHLORIDE: 105 mmol/L (ref 98–111)
CO2: 27 mmol/L (ref 22–32)
Calcium: 8.8 mg/dL — ABNORMAL LOW (ref 8.9–10.3)
Creatinine, Ser: 1.03 mg/dL — ABNORMAL HIGH (ref 0.44–1.00)
GFR calc non Af Amer: >60 mL/min (ref >60)
GLUCOSE: 185 mg/dL — AB (ref 70–99)
Potassium: 3.6 mmol/L (ref 3.5–5.1)
SODIUM: 140 mmol/L (ref 135–145)
Total Bilirubin: 0.7 mg/dL (ref 0.3–1.2)
Total Protein: 6.4 g/dL — ABNORMAL LOW (ref 6.5–8.1)

## 2018-06-27 LAB — RESPIRATORY PANEL BY PCR
Adenovirus: NOT DETECTED
BORDETELLA PERTUSSIS-RVPCR: NOT DETECTED
CHLAMYDOPHILA PNEUMONIAE-RVPPCR: NOT DETECTED
CORONAVIRUS 229E-RVPPCR: NOT DETECTED
CORONAVIRUS HKU1-RVPPCR: NOT DETECTED
Coronavirus NL63: NOT DETECTED
Coronavirus OC43: NOT DETECTED
Influenza A: NOT DETECTED
Influenza B: NOT DETECTED
METAPNEUMOVIRUS-RVPPCR: NOT DETECTED
MYCOPLASMA PNEUMONIAE-RVPPCR: NOT DETECTED
Parainfluenza Virus 1: NOT DETECTED
Parainfluenza Virus 2: NOT DETECTED
Parainfluenza Virus 3: NOT DETECTED
Parainfluenza Virus 4: NOT DETECTED
Respiratory Syncytial Virus: NOT DETECTED
Rhinovirus / Enterovirus: NOT DETECTED

## 2018-06-27 LAB — HIV ANTIBODY (ROUTINE TESTING W REFLEX): HIV Screen 4th Generation wRfx: NONREACTIVE

## 2018-06-27 LAB — GLUCOSE, CAPILLARY
GLUCOSE-CAPILLARY: 69 mg/dL — AB (ref 70–99)
GLUCOSE-CAPILLARY: 95 mg/dL (ref 70–99)
GLUCOSE-CAPILLARY: 155 mg/dL — AB (ref 70–99)
GLUCOSE-CAPILLARY: 132 mg/dL — AB (ref 70–99)

## 2018-06-27 MED ORDER — INSULIN GLARGINE 100 UNITS/ML SOLOSTAR PEN: 30.0000 [IU] | PEN_INJECTOR | Freq: Every day | SUBCUTANEOUS | 11 refills | Status: AC

## 2018-06-27 MED ORDER — DOXYCYCLINE HYCLATE 100 MG PO TABS: 100.0000 mg | ORAL_TABLET | Freq: Two times a day (BID) | ORAL | 0 refills | Status: DC

## 2018-06-27 NOTE — Progress Notes (Signed)
Hypoglycemic Event  CBG: 69  Treatment: 15 GM carbohydrate snack  Symptoms: None  Follow-up CBG: Time:95 CBG Result:95  Possible Reasons for Event: Unknown  Comments/MD notified:    Dana Fowler

## 2018-06-27 NOTE — Progress Notes (Signed)
[name] to be D/C'd Home per MD order.  Discussed with the patient and all questions fully answered.  VSS, Skin clean, dry and intact without evidence of skin break down, no evidence of skin tears noted. IV catheter discontinued intact. Site without signs and symptoms of complications. Dressing and pressure applied.  An After Visit Summary was printed and given to the patient. Patient received prescription.  D/c education completed with patient/family including follow up instructions, medication list, d/c activities limitations if indicated, with other d/c instructions as indicated by MD - patient able to verbalize understanding, all questions fully answered.   Patient instructed to return to ED, call 911, or call MD for any changes in condition.   Patient escorted via [place], and D/C home via private auto.  Richardean Chimera 06/27/2018 2:26 PM

## 2018-06-27 NOTE — Care Management Note (Signed)
Case Management Note  Patient Details  Name: Dana Fowler MRN: [id number] Date of Birth: 1966-04-27  Subjective/Objective:     Admitted with PNA, hx of obesity, insulin-dependent diabetes, hypertension. Resides alone. Independent with ADL'S , no DME usage.   [address]) Dana Fowler [place] ([phone]     PCP: Dr.  Revonda Humphrey  Action/Plan: Transition to home when medically stable. No needs identified per NCM. Pt states has transportation to home.  Expected Discharge Date:    06/27/2018           Expected Discharge Plan:  Home/Self Care  In-House Referral:     Discharge planning Services     Post Acute Care Choice:    Choice offered to:     DME Arranged:   N/A DME Agency:   N/A  HH Arranged:   N/A HH Agency:   N/A  Status of Service:  Completed, signed off  If discussed at [place] of Stay Meetings, dates discussed:    Additional Comments:  Sharin Mons, RN 06/27/2018, 10:10 AM

## 2018-06-27 NOTE — Discharge Summary (Signed)
Physician Discharge Summary  SAMI ROES [id number] DOB: [birth year] DOA: 06/26/2018  PCP: Nolene Ebbs, MD  Admit date: 06/26/2018 Discharge date: 06/27/2018  Admitted From: home Discharge disposition: home   Recommendations for Outpatient Follow-Up:   1. Titration of insulin for blood sugar coverage 2. Pending labs: HIV, HgbA1c 3. Holding HCTZ/ARB for now-- resume as able outpatient   Discharge Diagnosis:   Active Problems:   Pneumonia    Discharge Condition: Improved.  Diet recommendation: Low sodium, heart healthy.  Carbohydrate-modified.  Wound care: None.  Code status: Full.   History of Present Illness:   52 year old woman with medical problems including obesity, insulin-dependent diabetes, hypertension presented with a chief complaint of cough. History is obtained via chart review, as well as patient history.  She reports onset of non-productive cough about one week ago, not improving. She reports worsening weakness over the past 24 hours and prompted admission. She denies any fevers, chills, PND, orthopnea, lower extremity edema, chest pain. She denies any recent antibiotic use. She reports waking up typically on most nights between 1 AM and 4 AM and check her blood sugar which is typically been less than 60, she drinks juice or eat snack to help bring her blood sugars back within normal limits. She reports having sweats, denies syncope. She denies any sick contacts, no hemoptysis.  She lives alone, does not have any pets, is not a smoker, does not drink alcohol. Originally she is from [place], but has lived in [place] for over decade. She works as a Orthoptist. She reports regarding her diabetes that her A1c was over 10 when last checked, she reports taking basal insulin 30 units twice a day, her last dose of Lantus was the morning prior to admission. She reports taking rapid acting insulin 2-3 times a day ranging from 8-12 units. She  also reports taking metformin.  Of note, also reports hypotensive episodes at home prior to admission, with SBP ~100 mm Hg which is atypical for her.   Hospital Course by Problem:   Pneumonia, bacterial suspected -improved on PO doxy -finish course at home -NP swab negative  Diabetes, insulin dependent with hypoglycemia Cut basal insulin in half  -update A1C- pending  Obesity:  Body mass index is 30.81 kg/m.   HTN:  -hold home ARB / HCTZ for now -? Resume with PCP  AKI, possible:  -improved with hydration    Medical Consultants:      Discharge Exam:   Vitals:   06/26/18 2255 06/27/18 0434  BP: (!) 126/93 135/86  Pulse: 82 66  Resp: 18 16  Temp: 98.4 F (36.9 C) 97.9 F (36.6 C)  SpO2: 99% 99%   Vitals:   06/26/18 2145 06/26/18 2200 06/26/18 2255 06/27/18 0434  BP:  (!) 146/96 (!) 126/93 135/86  Pulse: 79 82 82 66  Resp: 18 13 18 16   Temp:   98.4 F (36.9 C) 97.9 F (36.6 C)  TempSrc:   Oral Oral  SpO2: 97% 99% 99% 99%  Weight:   86.6 kg   Height:   5\' 6"  (1.676 m)     General exam: Appears calm and comfortable. -- feeling much better and wanting to go home   The results of significant diagnostics from this hospitalization (including imaging, microbiology, ancillary and laboratory) are listed below for reference.     Procedures and Diagnostic Studies:   Dg Chest 2 View  Result Date: 06/26/2018 CLINICAL DATA:  Weakness. Shortness of breath.  Cough. Palpitations. EXAM: CHEST - 2 VIEW COMPARISON:  None. FINDINGS: The heart size is normal. Mild edema is present. Airspace consolidation is present. Visualized soft tissues and bony thorax are unremarkable. IMPRESSION: 1. Diffuse interstitial pattern most consistent with edema. This may be related to atypical infection. No consolidated airspace disease is present. Electronically Signed   By: San Morelle M.D.   On: 06/26/2018 20:17     Labs:   Basic Metabolic Panel: Recent Labs  Lab  06/26/18 1702 06/27/18 0752  NA 140 140  K 3.6 3.6  CL 104 105  CO2 25 27  GLUCOSE 71 185*  BUN 17 12  CREATININE 1.35* 1.03*  CALCIUM 9.0 8.8*   GFR Estimated Creatinine Clearance: 70.8 mL/min (A) (by C-G formula based on SCr of 1.03 mg/dL (H)). Liver Function Tests: Recent Labs  Lab 06/27/18 0752  AST 19  ALT 17  ALKPHOS 65  BILITOT 0.7  PROT 6.4*  ALBUMIN 3.1*   No results for input(s): LIPASE, AMYLASE in the last 168 hours. No results for input(s): AMMONIA in the last 168 hours. Coagulation profile No results for input(s): INR, PROTIME in the last 168 hours.  CBC: Recent Labs  Lab 06/26/18 1702 06/27/18 0752  WBC 6.4 4.2  HGB 12.4 11.4*  HCT 40.4 36.1  MCV 81.5 80.8  PLT 234 167   Cardiac Enzymes: No results for input(s): CKTOTAL, CKMB, CKMBINDEX, TROPONINI in the last 168 hours. BNP: Invalid input(s): POCBNP CBG: Recent Labs  Lab 06/26/18 2336 06/27/18 0626 06/27/18 0647 06/27/18 0841 06/27/18 1213  GLUCAP 222* 69* 95 155* 132*   D-Dimer No results for input(s): DDIMER in the last 72 hours. Hgb A1c No results for input(s): HGBA1C in the last 72 hours. Lipid Profile No results for input(s): CHOL, HDL, LDLCALC, TRIG, CHOLHDL, LDLDIRECT in the last 72 hours. Thyroid function studies No results for input(s): TSH, T4TOTAL, T3FREE, THYROIDAB in the last 72 hours.  Invalid input(s): FREET3 Anemia work up No results for input(s): VITAMINB12, FOLATE, FERRITIN, TIBC, IRON, RETICCTPCT in the last 72 hours. Microbiology Recent Results (from the past 240 hour(s))  Respiratory Panel by PCR     Status: None   Collection Time: 06/26/18 11:11 PM  Result Value Ref Range Status   Adenovirus NOT DETECTED NOT DETECTED Final   Coronavirus 229E NOT DETECTED NOT DETECTED Final   Coronavirus HKU1 NOT DETECTED NOT DETECTED Final   Coronavirus NL63 NOT DETECTED NOT DETECTED Final   Coronavirus OC43 NOT DETECTED NOT DETECTED Final   Metapneumovirus NOT DETECTED  NOT DETECTED Final   Rhinovirus / Enterovirus NOT DETECTED NOT DETECTED Final   Influenza A NOT DETECTED NOT DETECTED Final   Influenza B NOT DETECTED NOT DETECTED Final   Parainfluenza Virus 1 NOT DETECTED NOT DETECTED Final   Parainfluenza Virus 2 NOT DETECTED NOT DETECTED Final   Parainfluenza Virus 3 NOT DETECTED NOT DETECTED Final   Parainfluenza Virus 4 NOT DETECTED NOT DETECTED Final   Respiratory Syncytial Virus NOT DETECTED NOT DETECTED Final   Bordetella pertussis NOT DETECTED NOT DETECTED Final   Chlamydophila pneumoniae NOT DETECTED NOT DETECTED Final   Mycoplasma pneumoniae NOT DETECTED NOT DETECTED Final    Comment: Performed at [facility], [place] [address] [place], [place] [address]     Discharge Instructions:   Discharge Instructions    Diet - low sodium heart healthy   Complete by:  As directed    Diet Carb Modified   Complete by:  As directed  Discharge instructions   Complete by:  As directed    Bring log of blood sugars to PCP to adjust insulin dosage to avoid the AM lows   Increase activity slowly   Complete by:  As directed      Allergies as of 06/27/2018      Reactions   Iodinated Diagnostic Agents Nausea And Vomiting   Rxn to MRI dye 8/14   Penicillins Itching   Childhood rxn ~52 years old. Has patient had a PCN reaction causing immediate rash, facial/tongue/throat swelling, SOB or lightheadedness with hypotension: Yes Has patient had a PCN reaction causing severe rash involving mucus membranes or skin necrosis: No Has patient had a PCN reaction that required hospitalization: No Has patient had a PCN reaction occurring within the last 10 years: No If all of the above answers are "NO", then may proceed with Cephalosporin use.      Medication List    STOP taking these medications   ibuprofen 800 MG tablet Commonly known as:  ADVIL,MOTRIN     TAKE these medications   atorvastatin 40 MG tablet Commonly known as:  LIPITOR Take 40 mg by  mouth daily.   doxycycline 100 MG tablet Commonly known as:  VIBRA-TABS Take 1 tablet (100 mg total) by mouth every 12 (twelve) hours.   insulin glargine 100 unit/mL Sopn Commonly known as:  LANTUS Inject 0.3 mLs (30 Units total) into the skin daily. What changed:  when to take this   insulin lispro 100 UNIT/ML KiwkPen Commonly known as:  HUMALOG Inject 10 units in the morning and 12 units in the evening.  Total of 22 units per day What changed:    how much to take  how to take this  when to take this  additional instructions   metFORMIN 500 MG tablet Commonly known as:  GLUCOPHAGE Take 500 mg by mouth 2 (two) times daily.   olmesartan-hydrochlorothiazide 40-12.5 MG tablet Commonly known as:  BENICAR HCT Take 1 tablet by mouth daily.   ONE TOUCH ULTRA TEST test strip Generic drug:  glucose blood Use as instructed to check  blood sugars 4 times per  day What changed:  See the new instructions.   OVER THE COUNTER MEDICATION Take 2-3 tablets by mouth See admin instructions. Take 2-3 tablets of CURAMIN daily before breakfast as needed for pain   VITAMIN B-12 PO Take 1 mL by mouth once.      Follow-up Information    Nolene Ebbs, MD Follow up in 1 week(s).   Specialty:  Internal Medicine Contact information: [place] [address] [place] [address] [phone]            Time coordinating discharge: 35 min  Signed:  Geradine Girt  [facility] 06/27/2018, 12:49 PM

## 2018-06-28 LAB — HEMOGLOBIN A1C
HEMOGLOBIN A1C: 8.9 % — AB (ref 4.8–5.6)
MEAN PLASMA GLUCOSE: 209 mg/dL

## 2018-08-09 ENCOUNTER — Ambulatory Visit
Admission: RE | Admit: 2018-08-09 | Discharge: 2018-08-09 | Disposition: A | Discharge status: Home / Self Care | Payer: 59 | Source: Ambulatory Visit | Attending: Internal Medicine | Admitting: Internal Medicine

## 2018-08-09 ENCOUNTER — Inpatient Hospital Stay: Admission: RE | Admit: 2018-08-09 | Payer: 59 | Source: Ambulatory Visit

## 2018-08-09 DIAGNOSIS — E2839 Other primary ovarian failure: Secondary | ICD-10-CM

## 2018-08-09 DIAGNOSIS — Z1231 Encounter for screening mammogram for malignant neoplasm of breast: Secondary | ICD-10-CM

## 2018-08-10 ENCOUNTER — Other Ambulatory Visit: Payer: Self-pay | Admitting: Internal Medicine

## 2018-08-10 DIAGNOSIS — R928 Other abnormal and inconclusive findings on diagnostic imaging of breast: Secondary | ICD-10-CM

## 2018-08-16 ENCOUNTER — Ambulatory Visit: Payer: 59

## 2018-08-16 ENCOUNTER — Ambulatory Visit
Admission: RE | Admit: 2018-08-16 | Discharge: 2018-08-16 | Disposition: A | Discharge status: Home / Self Care | Payer: 59 | Source: Ambulatory Visit | Attending: Internal Medicine | Admitting: Internal Medicine

## 2018-08-16 DIAGNOSIS — R928 Other abnormal and inconclusive findings on diagnostic imaging of breast: Secondary | ICD-10-CM

## 2019-07-21 ENCOUNTER — Other Ambulatory Visit: Payer: Self-pay | Admitting: Internal Medicine

## 2019-07-21 DIAGNOSIS — Z1231 Encounter for screening mammogram for malignant neoplasm of breast: Secondary | ICD-10-CM

## 2019-08-21 ENCOUNTER — Other Ambulatory Visit: Payer: Self-pay

## 2019-08-21 ENCOUNTER — Ambulatory Visit
Admission: RE | Admit: 2019-08-21 | Discharge: 2019-08-21 | Disposition: A | Discharge status: Home / Self Care | Payer: Managed Care, Other (non HMO) | Source: Ambulatory Visit | Attending: Internal Medicine | Admitting: Internal Medicine

## 2019-08-21 DIAGNOSIS — Z1231 Encounter for screening mammogram for malignant neoplasm of breast: Secondary | ICD-10-CM

## 2019-12-12 ENCOUNTER — Other Ambulatory Visit: Payer: Self-pay

## 2019-12-12 ENCOUNTER — Emergency Department (HOSPITAL_COMMUNITY)
Admission: EM | Admit: 2019-12-12 | Discharge: 2019-12-12 | Disposition: A | Discharge status: Home / Self Care | Payer: Managed Care, Other (non HMO) | Attending: Emergency Medicine | Admitting: Emergency Medicine

## 2019-12-12 ENCOUNTER — Encounter (HOSPITAL_COMMUNITY): Payer: Self-pay | Admitting: Emergency Medicine

## 2019-12-12 DIAGNOSIS — Z23 Encounter for immunization: Secondary | ICD-10-CM | POA: Insufficient documentation

## 2019-12-12 DIAGNOSIS — E119 Type 2 diabetes mellitus without complications: Secondary | ICD-10-CM | POA: Insufficient documentation

## 2019-12-12 DIAGNOSIS — Z79899 Other long term (current) drug therapy: Secondary | ICD-10-CM | POA: Insufficient documentation

## 2019-12-12 DIAGNOSIS — L0291 Cutaneous abscess, unspecified: Secondary | ICD-10-CM

## 2019-12-12 DIAGNOSIS — Z794 Long term (current) use of insulin: Secondary | ICD-10-CM | POA: Insufficient documentation

## 2019-12-12 DIAGNOSIS — L02415 Cutaneous abscess of right lower limb: Secondary | ICD-10-CM | POA: Insufficient documentation

## 2019-12-12 DIAGNOSIS — I10 Essential (primary) hypertension: Secondary | ICD-10-CM | POA: Insufficient documentation

## 2019-12-12 MED ORDER — LIDOCAINE HCL (PF) 1 % IJ SOLN
5.0000 mL | Freq: Once | INTRAMUSCULAR | Status: AC
  Administered 2019-12-12: 5 mL
  Filled 2019-12-12: qty 5

## 2019-12-12 MED ORDER — TETANUS-DIPHTH-ACELL PERTUSSIS 5-2.5-18.5 LF-MCG/0.5 IM SUSP
0.5000 mL | Freq: Once | INTRAMUSCULAR | Status: AC
  Administered 2019-12-12: 0.5 mL via INTRAMUSCULAR
  Filled 2019-12-12: qty 0.5

## 2019-12-12 MED ORDER — DOXYCYCLINE HYCLATE 100 MG PO CAPS: 100.0000 mg | ORAL_CAPSULE | Freq: Two times a day (BID) | ORAL | 0 refills | Status: AC

## 2019-12-12 NOTE — Discharge Instructions (Addendum)
You were seen in the emergency department for a skin abscess- please see the attached handout for further information regarding this diagnoses. This area was incised and drained to help release the bacteria. We would like you to apply warm compresses and warm flushes to this area 4-5 times per day to help facilitate further draining as needed. We are also starting you on doxycycline, an antibiotic, in order to help treat the infection.   We have prescribed you new medication(s) today. Discuss the medications prescribed today with your pharmacist as they can have adverse effects and interactions with your other medicines including over the counter and prescribed medications. Seek medical evaluation if you start to experience new or abnormal symptoms after taking one of these medicines, seek care immediately if you start to experience difficulty breathing, feeling of your throat closing, facial swelling, or rash as these could be indications of a more serious allergic reaction  We would like you to have this area rechecked within 48 hours- please return to the ER , go to an urgent care, or see your primary care provider for this. Return to the ER sooner for new or worsening symptoms including, but not limited to increased pain, spreading redness, fevers, inability to keep fluids down, or any other concerns that you may have.    Please also follow up with your primary care within 1-2 weeks as your blood pressure was elevated in the ER and we would like you have this rechecked.  Vitals:   12/12/19 1018  BP: (!) 163/97  Pulse: 83  Resp: 16  Temp: 98.2 F (36.8 C)  SpO2: 99%

## 2019-12-12 NOTE — ED Triage Notes (Signed)
Pt reports abscess to posterior leg since Wednesday. Denies recent fever. Pt ambulatory, NAD.

## 2019-12-12 NOTE — ED Provider Notes (Signed)
[facility] EMERGENCY DEPARTMENT Provider Note   CSN: [id number] Arrival date & time: 12/12/19  1015     History Chief Complaint  Patient presents with  . Abscess    Dana Fowler is a 54 y.o. female with a history of anemia, diabetes mellitus, hypertension, and sickle cell trait who presents to the emergency department with complaints of concern for abscess to her right posterior thigh/buttocks area over the past few days.  Patient states she has an area of swelling, redness, and discomfort to the right posterior thigh/buttocks area, seems to be progressively worsening, no alleviating or aggravating factors.  States she has a history of similar which have required I&D in the medical setting at times but have also spontaneously resolved.  She has not had any active drainage.  She denies fever, chills, vomiting, or pain with bowel movements.  She states her blood sugars have been running 75-200.  HPI     Past Medical History:  Diagnosis Date  . Anemia   . Blood transfusion without reported diagnosis   . Diabetes mellitus   . Dysmenorrhea   . Fibroids   . Hernia, inguinal, right    pt states she got this with sx in 2014  . Hypertension   . Menorrhagia   . Pelvic pain in female   . 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 Sickle cell trait ([place])   . Yeast vaginitis     Patient Active Problem List   Diagnosis Date Noted  . Pneumonia 06/26/2018  . 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 Type II or unspecified type diabetes mellitus without mention of complication, uncontrolled 06/06/2013  . Unspecified essential hypertension 06/06/2013  . Other and unspecified hyperlipidemia 06/06/2013  . Fibroids 09/12/2012  . Abscess 08/23/2012    Past Surgical History:  Procedure Laterality Date  . DILATATION & CURRETTAGE/HYSTEROSCOPY WITH RESECTOCOPE N/A 09/05/2013   Procedure: DILATATION & CURETTAGE/HYSTEROSCOPY WITH RESECTOCOPE;   Surgeon: Marvene Staff, MD;  Location: [facility];  Service: Gynecology;  Laterality: N/A;  . HYSTEROSCOPY WITH D & C    . INDUCED ABORTION    . ROBOT ASSISTED MYOMECTOMY N/A 09/05/2013   Procedure: ROBOTIC ASSISTED MYOMECTOMY with morcellation;  Surgeon: Marvene Staff, MD;  Location: [facility];  Service: Gynecology;  Laterality: N/A;  3 hrs.     OB History    Gravida  1   Para  0   Term      Preterm      AB      Living  0     SAB      TAB      Ectopic      Multiple      Live Births              Family History  Problem Relation Age of Onset  . Hypertension Maternal Grandfather   . Diabetes Father   . Diabetes Mother   . Hypertension Sister   . Heart disease Neg Hx   . Colon cancer Neg Hx   . Breast cancer Neg Hx     Social History   Tobacco Use  . Smoking status: Never Smoker  . Smokeless tobacco: Never Used  Substance Use Topics  . Alcohol use: No    Comment: RARE  . Drug use: No    Home Medications Prior to Admission medications   Medication Sig Start Date End Date Taking? Authorizing Provider  atorvastatin (LIPITOR) 40 MG tablet  Take 40 mg by mouth daily. 04/04/18   [provider]  Cyanocobalamin (VITAMIN B-12 PO) Take 1 mL by mouth once.    [provider]  doxycycline (VIBRA-TABS) 100 MG tablet Take 1 tablet (100 mg total) by mouth every 12 (twelve) hours. 06/27/18   Geradine Girt, DO  insulin glargine (LANTUS) 100 unit/mL SOPN Inject 0.3 mLs (30 Units total) into the skin daily. 06/27/18   Geradine Girt, DO  insulin lispro (HUMALOG KWIKPEN) 100 UNIT/ML SOPN Inject 10 units in the morning and 12 units in the evening.  Total of 22 units per day Patient taking differently: Inject 5-14 Units into the skin See admin instructions. Inject 5-14 units subcutaneously two to three times daily with meals - dose based on carb count 08/03/13   Elayne Snare, MD  metFORMIN (GLUCOPHAGE) 500 MG tablet Take 500 mg by mouth 2 (two) times  daily. 04/19/18   [provider]  olmesartan-hydrochlorothiazide (BENICAR HCT) 40-12.5 MG tablet Take 1 tablet by mouth daily. 06/12/18   [provider]  ONE TOUCH ULTRA TEST test strip Use as instructed to check  blood sugars 4 times per  day Patient taking differently: by Other route See admin instructions. Use as instructed to check blood sugars 4 times per day 05/08/14   Elayne Snare, MD  OVER THE COUNTER MEDICATION Take 2-3 tablets by mouth See admin instructions. Take 2-3 tablets of CURAMIN daily before breakfast as needed for pain    [provider]    Allergies    Iodinated diagnostic agents and Penicillins  Review of Systems   Review of Systems  Constitutional: Negative for chills and fever.  Respiratory: Negative for shortness of breath.   Cardiovascular: Negative for chest pain.  Gastrointestinal: Negative for abdominal pain, rectal pain and vomiting.  Skin: Positive for wound.  Neurological: Negative for weakness and numbness.    Physical Exam Updated Vital Signs BP (!) 163/97 (BP Location: Left Arm)   Pulse 83   Temp 98.2 F (36.8 C) (Oral)   Resp 16   Ht 5\' 5"  (1.651 m)   Wt 83 kg   LMP 07/09/2018   SpO2 99%   BMI 30.45 kg/m   Physical Exam Vitals and nursing note reviewed.  Constitutional:      General: She is not in acute distress.    Appearance: She is not ill-appearing or toxic-appearing.  HENT:     Head: Normocephalic and atraumatic.  Cardiovascular:     Pulses:          Dorsalis pedis pulses are 2+ on the right side and 2+ on the left side.       Posterior tibial pulses are 2+ on the right side and 2+ on the left side.  Pulmonary:     Effort: Pulmonary effort is normal.  Musculoskeletal:     Comments: Lower extremities: RLE: There is a 5 cm diameter area of erythema, swelling, and induration with a somewhat central abrasion to the area just below the right gluteal fold posteriorly.  This area is tender to palpation.  There is  no obvious palpable fluctuance.  This is not extend to the rectum or perineum.  She has intact active range of motion throughout without point/focal bony tenderness.  Compartments are soft.  Skin:    General: Skin is warm and dry.     Capillary Refill: Capillary refill takes less than 2 seconds.  Neurological:     Mental Status: She is alert.  Comments: Alert. Clear speech. Sensation grossly intact to bilateral lower extremities. 5/5 strength with plantar/dorsiflexion bilaterally. Patient ambulatory.   Psychiatric:        Mood and Affect: Mood normal.        Behavior: Behavior normal.     ED Results / Procedures / Treatments   Labs (all labs ordered are listed, but only abnormal results are displayed) Labs Reviewed - No data to display  EKG None  Radiology No results found.  Procedures .Marland KitchenIncision and Drainage  Date/Time: 12/12/2019 12:22 PM Performed by: Amaryllis Dyke, PA-C Authorized by: Amaryllis Dyke, PA-C   Consent:    Consent obtained:  Verbal   Consent given by:  Patient   Risks discussed:  Bleeding, damage to other organs, infection, incomplete drainage and pain   Alternatives discussed:  Observation (w/ abx) Location:    Type:  Abscess   Size:  2   Location:  Lower extremity   Lower extremity location:  Leg   Leg location:  R upper leg Pre-procedure details:    Skin preparation:  Betadine Anesthesia (see MAR for exact dosages):    Anesthesia method:  Local infiltration   Local anesthetic:  Lidocaine 1% w/o epi Procedure type:    Complexity:  Simple Procedure details:    Incision types:  Stab incision   Scalpel blade:  11   Wound management:  Probed and deloculated and irrigated with saline   Drainage:  Bloody and purulent   Drainage amount: mild.   Wound treatment:  Wound left open   Packing materials:  None Post-procedure details:    Patient tolerance of procedure:  Procedure terminated at patient's request Comments:     Local  anesthesia with lidocaine administered which patient did not tolerate well as this was uncomfortable- frequently attempted to move my hand, checked to ensure no sharp sensation prior to incision after lidocaine, patient denied any sharpness, made small incision which patient states was uncomfortable, was able to probe/deloculate & irrigate, felt incision needed to be extended some- patient did not wish to have further lidocaine or further incision therefore procedure was terminated.  Ultrasound ED Soft Tissue  Date/Time: 12/12/2019 12:00 PM Performed by: Amaryllis Dyke, PA-C Authorized by: Amaryllis Dyke, PA-C   Procedure details:    Indications: localization of abscess     Transverse view:  Visualized   Longitudinal view:  Visualized   Images: archived   Location:    Location: lower extremity     Side:  Right Findings:     abscess present   (including critical care time)  Medications Ordered in ED Medications  lidocaine (PF) (XYLOCAINE) 1 % injection 5 mL (5 mLs Infiltration Given 12/12/19 1138)  Tdap (BOOSTRIX) injection 0.5 mL (0.5 mLs Intramuscular Given 12/12/19 1137)    ED Course  I have reviewed the triage vital signs and the nursing notes.  Pertinent labs & imaging results that were available during my care of the patient were reviewed by me and considered in my medical decision making (see chart for details).    MDM Rules/Calculators/A&P                      Patient presents to the ED with abscess amenable to I&D based on exam confirmed on bedside US. NO extension to rectum/perineum. Procedure per note above. Given mild surrounding cellulitis as well as concern that area needed to be further incised/drained but patient did not tolerate well will start patient on  Doxycycline. Recommended application of warm compresses/soaks/flushing. Will have patient return for wound recheck in 2 days. I discussed treatment plan, need for follow-up, and return precautions with  the patient. Provided opportunity for questions, patient confirmed understanding and is in agreement with plan.    Final Clinical Impression(s) / ED Diagnoses Final diagnoses:  Abscess    Rx / DC Orders ED Discharge Orders         Ordered    doxycycline (VIBRAMYCIN) 100 MG capsule  2 times daily     12/12/19 [address] PA-C 12/12/19 1229    Hayden Rasmussen, MD 12/12/19 [place]

## 2019-12-12 NOTE — ED Notes (Signed)
Patient verbalizes understanding of discharge instructions. Opportunity for questioning and answers were provided. Armband removed by staff, pt discharged from ED.  

## 2021-01-02 ENCOUNTER — Other Ambulatory Visit: Payer: Self-pay | Admitting: Family Medicine

## 2021-01-02 DIAGNOSIS — Z1231 Encounter for screening mammogram for malignant neoplasm of breast: Secondary | ICD-10-CM

## 2021-02-26 ENCOUNTER — Ambulatory Visit
Admission: RE | Admit: 2021-02-26 | Discharge: 2021-02-26 | Disposition: A | Discharge status: Home / Self Care | Payer: Managed Care, Other (non HMO) | Source: Ambulatory Visit | Attending: Family Medicine | Admitting: Family Medicine

## 2021-02-26 ENCOUNTER — Other Ambulatory Visit: Payer: Self-pay

## 2021-02-26 DIAGNOSIS — Z1231 Encounter for screening mammogram for malignant neoplasm of breast: Secondary | ICD-10-CM

## 2021-08-26 ENCOUNTER — Encounter: Payer: Self-pay | Admitting: Gastroenterology

## 2022-04-22 ENCOUNTER — Other Ambulatory Visit: Payer: Self-pay | Admitting: Internal Medicine

## 2022-04-22 DIAGNOSIS — Z1231 Encounter for screening mammogram for malignant neoplasm of breast: Secondary | ICD-10-CM

## 2022-05-01 ENCOUNTER — Ambulatory Visit
Admission: RE | Admit: 2022-05-01 | Discharge: 2022-05-01 | Disposition: A | Discharge status: Home / Self Care | Payer: Managed Care, Other (non HMO) | Source: Ambulatory Visit | Attending: Internal Medicine | Admitting: Internal Medicine

## 2022-05-01 DIAGNOSIS — Z1231 Encounter for screening mammogram for malignant neoplasm of breast: Secondary | ICD-10-CM

## 2023-05-27 ENCOUNTER — Encounter: Payer: Self-pay | Admitting: Gastroenterology

## 2024-02-12 ENCOUNTER — Emergency Department (HOSPITAL_COMMUNITY)

## 2024-02-12 ENCOUNTER — Other Ambulatory Visit: Payer: Self-pay

## 2024-02-12 ENCOUNTER — Encounter (HOSPITAL_COMMUNITY): Payer: Self-pay

## 2024-02-12 ENCOUNTER — Emergency Department (HOSPITAL_COMMUNITY)
Admission: EM | Admit: 2024-02-12 | Discharge: 2024-02-12 | Disposition: A | Discharge status: Home / Self Care | Attending: Emergency Medicine | Admitting: Emergency Medicine

## 2024-02-12 DIAGNOSIS — D649 Anemia, unspecified: Secondary | ICD-10-CM | POA: Diagnosis not present

## 2024-02-12 DIAGNOSIS — Z7984 Long term (current) use of oral hypoglycemic drugs: Secondary | ICD-10-CM | POA: Diagnosis not present

## 2024-02-12 DIAGNOSIS — Z794 Long term (current) use of insulin: Secondary | ICD-10-CM | POA: Diagnosis not present

## 2024-02-12 DIAGNOSIS — I10 Essential (primary) hypertension: Secondary | ICD-10-CM | POA: Insufficient documentation

## 2024-02-12 DIAGNOSIS — E119 Type 2 diabetes mellitus without complications: Secondary | ICD-10-CM | POA: Insufficient documentation

## 2024-02-12 DIAGNOSIS — Z79899 Other long term (current) drug therapy: Secondary | ICD-10-CM | POA: Insufficient documentation

## 2024-02-12 DIAGNOSIS — R030 Elevated blood-pressure reading, without diagnosis of hypertension: Secondary | ICD-10-CM | POA: Diagnosis present

## 2024-02-12 LAB — COMPREHENSIVE METABOLIC PANEL WITH GFR
ALT: 19 U/L (ref 0–44)
AST: 19 U/L (ref 15–41)
Albumin: 3.6 g/dL (ref 3.5–5.0)
Alkaline Phosphatase: 52 U/L (ref 38–126)
Anion gap: 10 (ref 5–15)
BUN: 16 mg/dL (ref 6–20)
CO2: 23 mmol/L (ref 22–32)
Calcium: 9.2 mg/dL (ref 8.9–10.3)
Chloride: 108 mmol/L (ref 98–111)
Creatinine, Ser: 1.13 mg/dL — ABNORMAL HIGH (ref 0.44–1.00)
GFR, Estimated: 56 mL/min — ABNORMAL LOW (ref >60)
Glucose, Bld: 91 mg/dL (ref 70–99)
Potassium: 4.1 mmol/L (ref 3.5–5.1)
Sodium: 141 mmol/L (ref 135–145)
Total Bilirubin: 0.7 mg/dL (ref 0.0–1.2)
Total Protein: 6.9 g/dL (ref 6.5–8.1)

## 2024-02-12 LAB — CBC WITH DIFFERENTIAL/PLATELET
Abs Immature Granulocytes: 0.01 10*3/uL (ref 0.00–0.07)
Basophils Absolute: 0 10*3/uL (ref 0.0–0.1)
Basophils Relative: 0 %
Eosinophils Absolute: 0.3 10*3/uL (ref 0.0–0.5)
Eosinophils Relative: 6 %
HCT: 35.8 % — ABNORMAL LOW (ref 36.0–46.0)
Hemoglobin: 11.3 g/dL — ABNORMAL LOW (ref 12.0–15.0)
Immature Granulocytes: 0 %
Lymphocytes Relative: 34 %
Lymphs Abs: 1.7 10*3/uL (ref 0.7–4.0)
MCH: 25.7 pg — ABNORMAL LOW (ref 26.0–34.0)
MCHC: 31.6 g/dL (ref 30.0–36.0)
MCV: 81.4 fL (ref 80.0–100.0)
Monocytes Absolute: 0.5 10*3/uL (ref 0.1–1.0)
Monocytes Relative: 9 %
Neutro Abs: 2.5 10*3/uL (ref 1.7–7.7)
Neutrophils Relative %: 51 %
Platelets: 176 10*3/uL (ref 150–400)
RBC: 4.4 MIL/uL (ref 3.87–5.11)
RDW: 15.5 % (ref 11.5–15.5)
WBC: 5 10*3/uL (ref 4.0–10.5)
nRBC: 0 % (ref 0.0–0.2)

## 2024-02-12 NOTE — ED Provider Notes (Signed)
 [place] EMERGENCY DEPARTMENT AT [facility] Provider Note   CSN: [id number] Arrival date & time: 02/12/24  1245     History  Chief Complaint  Patient presents with   Hypertension    Dana Fowler is a 58 y.o. female.  Patient history of hypertension, diabetes -- presents to the emergency department over concerns of persistently high blood pressure.  Patient states that she saw her primary care doctor about 10 days ago.  At that time she was having some back pain and she reports testing positive for UTI.  She was placed on a course of Macrobid which seems to have helped.  Currently reports elevated blood pressures with some mild exertional shortness of breath.  She has chronic mild lower extremity edema which is unchanged.  At her PCP appointment she states that she was started on 5 mg amlodipine.  She was previously on olmesartan-HCTZ.  She tells me that her doctor told her to stop this medication and that they might put her back on it later.  She also had her Mounjaro dose increased.  Blood pressure 159/93 here today.  No severe headache.  No strokelike symptoms.  No current dysuria, hematuria, increased frequency or urgency.       Home Medications Prior to Admission medications   Medication Sig Start Date End Date Taking? Authorizing Provider  atorvastatin (LIPITOR) 40 MG tablet Take 40 mg by mouth daily. 04/04/18   [provider]  Cyanocobalamin (VITAMIN B-12 PO) Take 1 mL by mouth once.    [provider]  doxycycline (VIBRAMYCIN) 100 MG capsule Take 1 capsule (100 mg total) by mouth 2 (two) times daily. 12/12/19   Petrucelli, Samantha R, PA-C  insulin glargine (LANTUS) 100 unit/mL SOPN Inject 0.3 mLs (30 Units total) into the skin daily. 06/27/18   Joseph Art, DO  insulin lispro (HUMALOG KWIKPEN) 100 UNIT/ML SOPN Inject 10 units in the morning and 12 units in the evening.  Total of 22 units per day Patient taking differently: Inject 5-14 Units  into the skin See admin instructions. Inject 5-14 units subcutaneously two to three times daily with meals - dose based on carb count 08/03/13   Reather Littler, MD  metFORMIN (GLUCOPHAGE) 500 MG tablet Take 500 mg by mouth 2 (two) times daily. 04/19/18   [provider]  olmesartan-hydrochlorothiazide (BENICAR HCT) 40-12.5 MG tablet Take 1 tablet by mouth daily. 06/12/18   [provider]  ONE TOUCH ULTRA TEST test strip Use as instructed to check  blood sugars 4 times per  day Patient taking differently: by Other route See admin instructions. Use as instructed to check blood sugars 4 times per day 05/08/14   Reather Littler, MD  OVER THE COUNTER MEDICATION Take 2-3 tablets by mouth See admin instructions. Take 2-3 tablets of CURAMIN daily before breakfast as needed for pain    [provider]      Allergies    Iodinated contrast media and Penicillins    Review of Systems   Review of Systems  Physical Exam Updated Vital Signs BP (!) 159/93   Pulse 92   Temp 98.7 F (37.1 C)   Resp 16   LMP 07/09/2018   SpO2 98%   Physical Exam Vitals and nursing note reviewed.  Constitutional:      Appearance: She is well-developed. She is not diaphoretic.  HENT:     Head: Normocephalic and atraumatic.     Mouth/Throat:     Mouth: Mucous membranes  are not dry.  Eyes:     Conjunctiva/sclera: Conjunctivae normal.  Neck:     Vascular: Normal carotid pulses. No JVD.     Trachea: Trachea normal. No tracheal deviation.     Comments: No jvd Cardiovascular:     Rate and Rhythm: Normal rate and regular rhythm.     Pulses: No decreased pulses.          Radial pulses are 2+ on the right side and 2+ on the left side.     Heart sounds: Normal heart sounds, S1 normal and S2 normal. No murmur heard. Pulmonary:     Effort: Pulmonary effort is normal. No respiratory distress.     Breath sounds: No wheezing.     Comments: Lungs clear to auscultation bilaterally Chest:     Chest wall: No  tenderness.  Abdominal:     General: Bowel sounds are normal.     Palpations: Abdomen is soft.     Tenderness: There is no abdominal tenderness. There is no guarding or rebound.  Musculoskeletal:        General: Normal range of motion.     Cervical back: Normal range of motion and neck supple. No muscular tenderness.     Right lower leg: Edema present.     Left lower leg: Edema present.     Comments: Trace bilateral lower extremity edema, pitting, of the ankles  Skin:    General: Skin is warm and dry.     Coloration: Skin is not pale.  Neurological:     Mental Status: She is alert.     ED Results / Procedures / Treatments   Labs (all labs ordered are listed, but only abnormal results are displayed) Labs Reviewed  CBC WITH DIFFERENTIAL/PLATELET - Abnormal; Notable for the following components:      Result Value   Hemoglobin 11.3 (*)    HCT 35.8 (*)    MCH 25.7 (*)    All other components within normal limits  COMPREHENSIVE METABOLIC PANEL WITH GFR - Abnormal; Notable for the following components:   Creatinine, Ser 1.13 (*)    GFR, Estimated 56 (*)    All other components within normal limits    ED ECG REPORT   Date: 02/12/2024  Rate: 89  Rhythm: normal sinus rhythm  QRS Axis: normal  Intervals: normal  ST/T Wave abnormalities: normal  Conduction Disutrbances:none  Narrative Interpretation:   Old EKG Reviewed: unchanged from 2019  I have personally reviewed the EKG tracing and agree with the computerized printout as noted.   Radiology DG Chest 2 View Result Date: 02/12/2024 CLINICAL DATA:  sob EXAM: CHEST - 2 VIEW COMPARISON:  June 26, 2018 FINDINGS: The cardiomediastinal silhouette is unchanged in contour. No pleural effusion. No pneumothorax. Peribronchial cuffing with bibasilar reticulonodular opacities. Visualized abdomen is unremarkable. Multilevel degenerative changes of the thoracic spine. IMPRESSION: Constellation of findings are favored to reflect  underlying small airways disease such as pulmonary edema. Electronically Signed   By: Meda Klinefelter M.D.   On: 02/12/2024 15:05    Procedures Procedures    Medications Ordered in ED Medications - No data to display  ED Course/ Medical Decision Making/ A&P    Patient seen and examined. History obtained directly from patient.  Patient does have PCP discharge summary with her at bedside.  Labs/EKG: Ordered CBC, BMP, EKG.  Imaging: Ordered chest x-ray.  Medications/Fluids: None ordered  Most recent vital signs reviewed and are as follows: BP (!) 159/93   Pulse  92   Temp 98.7 F (37.1 C)   Resp 16   LMP 07/09/2018   SpO2 98%   Initial impression: Hypertension.  Patient may either restart Benicar or increase amlodipine to 10 mg.  She is requesting some additional workup including labs and an EKG today.  This is reasonable given her reported dyspnea with exertion.  2:34 PM Reassessment performed. Patient appears stable, comfortable.  Labs personally reviewed and interpreted including: CBC with anemia unchanged from baseline otherwise unremarkable; CMP with mildly elevated creatinine unchanged from previous.   Reviewed pertinent lab work and imaging with patient at bedside. Questions answered.   Most current vital signs reviewed and are as follows: BP (!) 164/90   Pulse 80   Temp 98.7 F (37.1 C)   Resp 14   LMP 07/09/2018   SpO2 100%   Plan: Pending chest x-ray  2:50 PM Reassessment performed. Patient appears stable, comfortable. Lungs remain clear.   Imaging personally visualized and interpreted including: CXR, report question of edema, however this is not clinically consistent with patient presentation (clear lungs on exam without crackles, O2 sat 100%, no orthopnea)  Reviewed pertinent lab work and imaging with patient at bedside. Questions answered.   Most current vital signs reviewed and are as follows: BP (!) 164/90   Pulse 80   Temp 98.7 F (37.1 C)    Resp 14   LMP 07/09/2018   SpO2 100%   Plan: Discharge to home.  We discussed that she can take Benicar as this contains a diuretic.  She is talking with her primary care doctor next week.  Prescriptions written for: None  Other home care instructions discussed: Monitoring of symptoms  ED return instructions discussed: New or worsening of symptoms.  Follow-up instructions discussed: Patient encouraged to follow-up with their PCP as planned.                                Medical Decision Making Amount and/or Complexity of Data Reviewed Labs: ordered. Radiology: ordered.   Patient with hypertension, minimal symptoms. Vitals normal. Lungs are clear. ?edema on CXR but symptoms not clinically consistent with significant fluid overload.  Patient advised to take the Benicar which contains some HCTZ until PCP follow-up.  Her symptoms are not severe enough to warrant additional inpatient workup at this time.  The patient's vital signs, pertinent lab work and imaging were reviewed and interpreted as discussed in the ED course. Hospitalization was considered for further testing, treatments, or serial exams/observation. However as patient is well-appearing, has a stable exam, and reassuring studies today, I do not feel that they warrant admission at this time. This plan was discussed with the patient who verbalizes agreement and comfort with this plan and seems reliable and able to return to the Emergency Department with worsening or changing symptoms.          Final Clinical Impression(s) / ED Diagnoses Final diagnoses:  Hypertension, unspecified type    Rx / DC Orders ED Discharge Orders     None         Renne Crigler, PA-C 02/12/24 1515    Royanne Foots, DO 02/13/24 [phone]

## 2024-02-12 NOTE — Discharge Instructions (Addendum)
 Please read and follow all provided instructions.  Your diagnoses today include:  1. Hypertension, unspecified type     Your blood pressure was high today (BP): BP (!) 164/90   Pulse 80   Temp 98.7 F (37.1 C)   Resp 14   LMP 07/09/2018   SpO2 100%   Tests performed today include: Vital signs. See below for your results today.   Medications prescribed:  None  Home care instructions:  Follow any educational materials contained in this packet.  You may start taking the Benicar daily. Please follow-up with your primary care doctor next week as planned for further instructions.  Follow-up instructions: Please follow-up with your primary care provider next week for a recheck of your symptoms and blood pressure.    Return instructions:  Please return to the Emergency Department if you experience worsening symptoms.  Return with severe chest pain, abdominal pain, or shortness of breath.  Return with severe headache, focal weakness, numbness, difficulty with speech or vision. Return with loss of vision or sudden vision change. Please return if you have any other emergent concerns.  Additional Information:  Your vital signs today were: BP (!) 164/90   Pulse 80   Temp 98.7 F (37.1 C)   Resp 14   LMP 07/09/2018   SpO2 100%  If your blood pressure (BP) was elevated above 135/85 this visit, please have this repeated by your doctor within one month. --------------

## 2024-02-12 NOTE — ED Triage Notes (Signed)
 Pt states she was seen by her PCP 10 days ago and was given a new BP medication due to having HTN (SBP 160-150s) states it is still high. She called them 2 days ago and still hasn't heard back. Pt denies any other associated symptoms.

## 2024-03-13 NOTE — Therapy (Signed)
 OUTPATIENT PHYSICAL THERAPY THORACOLUMBAR EVALUATION   Patient Name: Dana Fowler MRN: [id number] DOB:[birth year], 58 y.o., female Today's Date: 03/14/2024  END OF SESSION:  PT End of Session - 03/14/24 1807     Visit Number 1    Number of Visits 13    Date for PT Re-Evaluation 05/05/24    Authorization Type [employer] MA[employer]GED    PT Start Time [phone]    PT Stop Time 0800    PT Time Calculation (min) 29 min    Activity Tolerance Patient tolerated treatment well    Behavior During Therapy [facility] for tasks assessed/performed             Past Medical History:  Diagnosis Date   Anemia    Blood transfusion without reported diagnosis    Diabetes mellitus    Dysmenorrhea    Fibroids    Hernia, inguinal, right    pt states she got this with sx in 2014   Hypertension    Menorrhagia    Pelvic pain in female    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   Type II or unspecified type diabetes mellitus without mention of complication, uncontrolled 06/06/2013   Essential hypertension 06/06/2013   Other and unspecified hyperlipidemia 06/06/2013   Fibroids 09/12/2012   Abscess 08/23/2012    PCP: Charle Congo, MD   REFERRING PROVIDER: Charle Congo, MD   REFERRING  DIAG: chronic back pain   Rationale for Evaluation and Treatment: Rehabilitation  THERAPY DIAG:  Other low back pain  Muscle weakness (generalized)  Abnormal posture  ONSET DATE: 2 months  SUBJECTIVE:                                                                                                                                                                                           SUBJECTIVE STATEMENT: Pt reports chronic pain of her back, neck, and shoulders with insidious  onset.   PERTINENT HISTORY:  High BMI, DM  PAIN:  Are you having pain? Yes: NPRS scale: 5/10 Pain location: low back Pain description: sharp Aggravating factors: Certain movements- bending, sitting Relieving factors: Heating  PRECAUTIONS: None  RED FLAGS: None   WEIGHT BEARING RESTRICTIONS: No  FALLS:  Has patient fallen in last 6 months? No  LIVING ENVIRONMENT: Lives with: lives alone Lives in: House/apartment Able to access home  OCCUPATION: Customer service- Computer  PLOF: Independent  PATIENT GOALS: Pain relief  NEXT MD VISIT: Not scheduled  OBJECTIVE:  Note: Objective measures were completed at Evaluation unless otherwise noted.  DIAGNOSTIC FINDINGS:  None recently for lumbar spine in Epic  PATIENT SURVEYS:  Modified Oswestry 9/50=18%   COGNITION: Overall cognitive status: Within functional limits for tasks assessed     SENSATION: WFL  MUSCLE LENGTH: Hamstrings: Right WNLs deg; Left WNLs deg Andy Bannister test: Right Tight deg; Left Tight deg  POSTURE: rounded shoulders, forward head, and increased lumbar lordosis  PALPATION: TTP to the R lumbar prarspinals  LUMBAR ROM:   AROM eval  Flexion 75%; LBP  Extension 75%; R lateral low back pain  Right lateral flexion 75%; R lateral trunk pain  Left lateral flexion 75%; R lateral trunk pain  Right rotation Full; R lateral trunk pain  Left rotation Full; R lateral trunk pain   (Blank rows = not tested)  LOWER EXTREMITY  ROM:     WFLS Active  Right eval Left eval  Hip flexion    Hip extension    Hip abduction    Hip adduction    Hip internal rotation    Hip external rotation    Knee flexion    Knee extension    Ankle dorsiflexion    Ankle plantarflexion    Ankle inversion    Ankle eversion     (Blank rows = not tested)  LOWER EXTREMITY MMT:     WFLS. Weak core MMT Right eval Left eval  Hip flexion    Hip extension    Hip abduction    Hip adduction    Hip internal rotation    Hip external rotation    Knee flexion    Knee extension    Ankle dorsiflexion    Ankle plantarflexion    Ankle inversion    Ankle eversion     (Blank rows = not tested)  LUMBAR SPECIAL TESTS:  Straight leg raise test: Negative and Slump test: Negative  FUNCTIONAL TESTS:  5 times sit to stand: TBA  GAIT: Distance walked: 200' Assistive device utilized: None Level of assistance: Complete Independence Comments: WNLs  TREATMENT DATE:  [facility] Adult PT Treatment:                                                DATE: 03/14/24 Therapeutic Exercise: Developed, instructed in, and pt completed therex as noted in HEP    PATIENT EDUCATION:  Education details: Eval findings, POC, HEP Person educated: Patient Education method: Explanation, Demonstration, Tactile cues, and Verbal cues Education comprehension: verbalized understanding, returned demonstration, verbal cues required, and tactile cues required  HOME EXERCISE PROGRAM: Access Code: [email or web address] URL: [email or web address] Date: 03/14/2024 Prepared by: Liborio Reeds  Exercises - Supine Lower Trunk Rotation  - 2 x daily - 7 x weekly - 1 sets - 10 reps - 3 hold - Hooklying Single Knee to  Chest  - 2 x daily - 7 x weekly - 1 sets - 3 reps - 30 hold - Supine March  - 2 x daily - 7 x weekly - 1 sets - 20 reps - 2 hold  ASSESSMENT:  CLINICAL IMPRESSION: Patient is a 58 y.o. female who was seen today for physical therapy evaluation and treatment for  chronic back pain. Pt presents with a weak core and increased lumbar lordosis. A HEP was initiated lumbar mobility. Pt graded her low back dysfunction on the MODI as low. Pt is to contact Dr. Avbuere for a referral for neck pain. Pt will benefit from skilled PT 2w6 to address impairments to optimize back function with less pain.   OBJECTIVE IMPAIRMENTS: decreased activity tolerance, decreased ROM, decreased strength, postural dysfunction, obesity, and pain.   ACTIVITY LIMITATIONS: carrying, lifting, bending, and sitting  PARTICIPATION LIMITATIONS: meal prep, cleaning, laundry, and occupation  PERSONAL FACTORS: Fitness, Past/current experiences, Time since onset of injury/illness/exacerbation, and 1-2 comorbidities: high BMI, DM  are also affecting patient's functional outcome.   REHAB POTENTIAL: Good  CLINICAL DECISION MAKING: Evolving/moderate complexity  EVALUATION COMPLEXITY: Moderate   GOALS:  SHORT TERM GOALS= LTGs  LONG TERM GOALS: Target date: 05/05/24  Pt will be Ind in a final HEP to maintain achieved LOF Baseline: started Goal status: INITIAL  2.  Pt will report 50% or greater improvement in her back pain with ADLs Baseline: 5/10 Goal status: INITIAL  3.  Pt will be able to demonstrate a bridge for 60" and a plank from her knees for 30" as demonstration of improved core strength Baseline:  Goal status: INITIAL  4.  Improve 5xSTS by MCID of 5" as indication of improved functional mobility  Baseline: TBA Goal status: INITIAL  5.  Pt's MODI score will improve 11% or less as indication of improved function  Baseline: 18% Goal status: INITIAL  PLAN:  PT FREQUENCY: 2x/week  PT DURATION: 6 weeks  PLANNED INTERVENTIONS: 97164- PT Re-evaluation, 97110-Therapeutic exercises, 97530- Therapeutic activity, 97112- Neuromuscular re-education, 97535- Self Care, 62952- Manual therapy, [id number]- Electrical stimulation (unattended), 84132- Traction (mechanical), Patient/Family  education, Taping, Dry Needling, Joint mobilization, Spinal mobilization, Cryotherapy, and Moist heat.  PLAN FOR NEXT SESSION: Assess 5xSTS, assess response to HEP; progress therex as indicated; use of modalities, manual therapy; and TPDN as indicated.    Sahily Biddle MS, PT 03/14/24 6:40 PM

## 2024-03-14 ENCOUNTER — Ambulatory Visit: Attending: Internal Medicine

## 2024-03-14 ENCOUNTER — Other Ambulatory Visit: Payer: Self-pay

## 2024-03-14 DIAGNOSIS — M6281 Muscle weakness (generalized): Secondary | ICD-10-CM | POA: Insufficient documentation

## 2024-03-14 DIAGNOSIS — M5459 Other low back pain: Secondary | ICD-10-CM | POA: Insufficient documentation

## 2024-03-14 DIAGNOSIS — R293 Abnormal posture: Secondary | ICD-10-CM | POA: Diagnosis present

## 2024-03-28 ENCOUNTER — Encounter

## 2024-03-30 ENCOUNTER — Ambulatory Visit

## 2024-04-03 ENCOUNTER — Ambulatory Visit: Admitting: Physical Therapy

## 2024-04-06 ENCOUNTER — Encounter: Admitting: Physical Therapy

## 2024-04-11 ENCOUNTER — Encounter

## 2024-04-13 ENCOUNTER — Encounter

## 2024-04-17 ENCOUNTER — Encounter: Admitting: Physical Therapy

## 2024-04-19 ENCOUNTER — Encounter

## 2024-04-25 ENCOUNTER — Other Ambulatory Visit: Payer: Self-pay | Admitting: Internal Medicine

## 2024-04-25 DIAGNOSIS — Z1231 Encounter for screening mammogram for malignant neoplasm of breast: Secondary | ICD-10-CM

## 2024-05-02 ENCOUNTER — Ambulatory Visit
Admission: RE | Admit: 2024-05-02 | Discharge: 2024-05-02 | Disposition: A | Discharge status: Home / Self Care | Source: Ambulatory Visit | Attending: Internal Medicine | Admitting: Internal Medicine

## 2024-05-02 DIAGNOSIS — Z1231 Encounter for screening mammogram for malignant neoplasm of breast: Secondary | ICD-10-CM
